# Patient Record
Sex: Female | Born: 1995 | Hispanic: Yes | Marital: Single | Smoking: Former smoker
Health system: Southern US, Community
[De-identification: ages and names within clinical notes are randomized; demographics above are authoritative.]

## PROBLEM LIST (undated history)

## (undated) ENCOUNTER — Inpatient Hospital Stay (HOSPITAL_COMMUNITY): Payer: Self-pay

## (undated) DIAGNOSIS — O1490 Unspecified pre-eclampsia, unspecified trimester: Secondary | ICD-10-CM

## (undated) DIAGNOSIS — Z789 Other specified health status: Secondary | ICD-10-CM

## (undated) DIAGNOSIS — F419 Anxiety disorder, unspecified: Secondary | ICD-10-CM

## (undated) DIAGNOSIS — O139 Gestational [pregnancy-induced] hypertension without significant proteinuria, unspecified trimester: Secondary | ICD-10-CM

## (undated) DIAGNOSIS — R519 Headache, unspecified: Secondary | ICD-10-CM

## (undated) HISTORY — DX: Unspecified pre-eclampsia, unspecified trimester: O14.90

## (undated) HISTORY — PX: NO PAST SURGERIES: SHX2092

---
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
## 2015-04-17 ENCOUNTER — Telehealth: Payer: Self-pay

## 2015-04-17 NOTE — Telephone Encounter (Signed)
have [facility] referral, but patient is already a patient of Dr. Verdell CarmineHarper's left a message for her to call us regarding appt.

## 2015-04-23 ENCOUNTER — Telehealth: Payer: Self-pay | Admitting: *Deleted

## 2015-04-23 NOTE — Telephone Encounter (Signed)
Patient referred to our office from [facility] for problems with her IUD.  Attempted to contact the patient and left message for patient to call the office to schedule appointment.

## 2015-08-24 NOTE — Telephone Encounter (Signed)
No return call from patient. Call to be re-filed.

## 2017-08-28 ENCOUNTER — Encounter (HOSPITAL_BASED_OUTPATIENT_CLINIC_OR_DEPARTMENT_OTHER): Payer: Self-pay

## 2017-08-28 ENCOUNTER — Emergency Department (HOSPITAL_BASED_OUTPATIENT_CLINIC_OR_DEPARTMENT_OTHER)
Admission: EM | Admit: 2017-08-28 | Discharge: 2017-08-28 | Disposition: A | Discharge status: Home / Self Care | Payer: Self-pay | Attending: Emergency Medicine | Admitting: Emergency Medicine

## 2017-08-28 ENCOUNTER — Other Ambulatory Visit: Payer: Self-pay

## 2017-08-28 DIAGNOSIS — R109 Unspecified abdominal pain: Secondary | ICD-10-CM | POA: Insufficient documentation

## 2017-08-28 DIAGNOSIS — Z5321 Procedure and treatment not carried out due to patient leaving prior to being seen by health care provider: Secondary | ICD-10-CM | POA: Insufficient documentation

## 2017-08-28 NOTE — ED Triage Notes (Signed)
C/o left flank pain x 2 weeks-NAD-steady gait

## 2018-02-17 ENCOUNTER — Ambulatory Visit: Payer: Self-pay | Admitting: Obstetrics

## 2018-06-30 ENCOUNTER — Ambulatory Visit (INDEPENDENT_AMBULATORY_CARE_PROVIDER_SITE_OTHER): Payer: Medicaid Other | Admitting: Obstetrics

## 2018-06-30 ENCOUNTER — Other Ambulatory Visit (HOSPITAL_COMMUNITY)
Admission: RE | Admit: 2018-06-30 | Discharge: 2018-06-30 | Disposition: A | Discharge status: Home / Self Care | Payer: Medicaid Other | Source: Ambulatory Visit | Attending: Obstetrics | Admitting: Obstetrics

## 2018-06-30 ENCOUNTER — Encounter: Payer: Self-pay | Admitting: Obstetrics

## 2018-06-30 VITALS — BP 121/81 | HR 96 | Wt 159.0 lb

## 2018-06-30 DIAGNOSIS — Z0000 Encounter for general adult medical examination without abnormal findings: Secondary | ICD-10-CM | POA: Diagnosis not present

## 2018-06-30 DIAGNOSIS — Z30016 Encounter for initial prescription of transdermal patch hormonal contraceptive device: Secondary | ICD-10-CM

## 2018-06-30 DIAGNOSIS — Z01419 Encounter for gynecological examination (general) (routine) without abnormal findings: Secondary | ICD-10-CM

## 2018-06-30 DIAGNOSIS — Z30432 Encounter for removal of intrauterine contraceptive device: Secondary | ICD-10-CM

## 2018-06-30 DIAGNOSIS — T8332XA Displacement of intrauterine contraceptive device, initial encounter: Secondary | ICD-10-CM

## 2018-06-30 DIAGNOSIS — F17210 Nicotine dependence, cigarettes, uncomplicated: Secondary | ICD-10-CM

## 2018-06-30 DIAGNOSIS — N946 Dysmenorrhea, unspecified: Secondary | ICD-10-CM

## 2018-06-30 DIAGNOSIS — Z3009 Encounter for other general counseling and advice on contraception: Secondary | ICD-10-CM

## 2018-06-30 MED ORDER — VARENICLINE TARTRATE 1 MG PO TABS: 1.0000 mg | ORAL_TABLET | Freq: Two times a day (BID) | ORAL | 1 refills | Status: DC

## 2018-06-30 MED ORDER — VARENICLINE TARTRATE 0.5 MG X 11 & 1 MG X 42 PO MISC: ORAL | 0 refills | Status: DC

## 2018-06-30 MED ORDER — NORELGESTROMIN-ETH ESTRADIOL 150-35 MCG/24HR TD PTWK: 1.0000 | MEDICATED_PATCH | TRANSDERMAL | 12 refills | Status: DC

## 2018-06-30 MED ORDER — IBUPROFEN 800 MG PO TABS: 800.0000 mg | ORAL_TABLET | Freq: Three times a day (TID) | ORAL | 5 refills | Status: DC | PRN

## 2018-06-30 NOTE — Progress Notes (Signed)
Subjective:        Robyn Stone is a 23 y.o. female here for a routine exam.  Current complaints: Pelvic pain for past few months.  She has a ParaGuard IUD in place. Evaluated recently in the ER at [facility], and her work-up was negative, including a normal pelvic ultrasound, but IUD was in the endometrial canal.    Personal health questionnaire:  Is patient Ashkenazi Jewish, have a family history of breast and/or ovarian cancer: no Is there a family history of uterine cancer diagnosed at age < 43, gastrointestinal cancer, urinary tract cancer, family member who is a Personnel officer syndrome-associated carrier: no Is the patient overweight and hypertensive, family history of diabetes, personal history of gestational diabetes, preeclampsia or PCOS: no Is patient over 63, have PCOS,  family history of premature CHD under age 27, diabetes, smoke, have hypertension or peripheral artery disease:  no At any time, has a partner hit, kicked or otherwise hurt or frightened you?: no Over the past 2 weeks, have you felt down, depressed or hopeless?: no Over the past 2 weeks, have you felt little interest or pleasure in doing things?:no   Gynecologic History Patient's last menstrual period was 06/03/2018. Contraception: IUD Last Pap: NONE. Results were: NONE Last mammogram: n/a. Results were: n/a  Obstetric History OB History  Gravida Para Term Preterm AB Living  1 1 1     1   SAB TAB Ectopic Multiple Live Births        0 1    # Outcome Date GA Lbr Len/2nd Weight Sex Delivery Anes PTL Lv  1 Term 08/29/14 [redacted]w[redacted]d 06:00 / 00:25 5 lb 7.1 oz (2.469 kg) M Vag-Spont EPI  LIV    Past Medical History:  Diagnosis Date  . Medical history non-contributory     Past Surgical History:  Procedure Laterality Date  . NO PAST SURGERIES       Current Outpatient Medications:  .  ibuprofen (ADVIL,MOTRIN) 800 MG tablet, Take 1 tablet (800 mg total) by mouth every 8 (eight) hours as needed., Disp: 30 tablet,  Rfl: 5 .  norelgestromin-ethinyl estradiol (XULANE) 150-35 MCG/24HR transdermal patch, Place 1 patch onto the skin once a week., Disp: 3 patch, Rfl: 12 .  varenicline (CHANTIX CONTINUING MONTH PAK) 1 MG tablet, Take 1 tablet (1 mg total) by mouth 2 (two) times daily., Disp: 60 tablet, Rfl: 1 .  varenicline (CHANTIX STARTING MONTH PAK) 0.5 MG X 11 & 1 MG X 42 tablet, Take one 0.5 mg tablet by mouth once daily for 3 days, then increase to one 0.5 mg tablet twice daily for 4 days, then increase to one 1 mg tablet twice daily., Disp: 53 tablet, Rfl: 0 No Known Allergies  Social History   Tobacco Use  . Smoking status: Current Every Day Smoker  . Smokeless tobacco: Never Used  . Tobacco comment: 1/2 day  Substance Use Topics  . Alcohol use: Yes    Alcohol/week: 0.0 standard drinks    Comment: weekly    Family History  Problem Relation Age of Onset  . Hypertension Mother       Review of Systems  Constitutional: negative for fatigue and weight loss Respiratory: negative for cough and wheezing Cardiovascular: negative for chest pain, fatigue and palpitations Gastrointestinal: negative for abdominal pain and change in bowel habits Musculoskeletal:negative for myalgias Neurological: negative for gait problems and tremors Behavioral/Psych: negative for abusive relationship, depression Endocrine: negative for temperature intolerance    Genitourinary:negative for abnormal  menstrual periods, genital lesions, hot flashes, sexual problems and vaginal discharge Integument/breast: negative for breast lump, breast tenderness, nipple discharge and skin lesion(s)    Objective:       BP 121/81   Pulse 96   Wt 159 lb (72.1 kg)   LMP 06/03/2018   BMI 29.08 kg/m  General:   alert  Skin:   no rash or abnormalities  Lungs:   clear to auscultation bilaterally  Heart:   regular rate and rhythm, S1, S2 normal, no murmur, click, rub or gallop  Breasts:   normal without suspicious masses, skin or  nipple changes or axillary nodes  Abdomen:  normal findings: no organomegaly, soft, non-tender and no hernia  Pelvis:  External genitalia: normal general appearance Urinary system: urethral meatus normal and bladder without fullness, nontender Vaginal: normal without tenderness, induration or masses Cervix: normal appearance Adnexa: normal bimanual exam Uterus: anteverted and non-tender, normal size   Lab Review Urine pregnancy test Labs reviewed yes Radiologic studies reviewed yes  50% of 25 min visit spent on counseling and coordination of care.   Assessment:     1. Encounter for routine gynecological examination with Papanicolaou smear of cervix Rx: - Cytology - PAP( [facility]) - varenicline (CHANTIX CONTINUING MONTH PAK) 1 MG tablet; Take 1 tablet (1 mg total) by mouth 2 (two) times daily.  Dispense: 60 tablet; Refill: 1  2. Tobacco dependence due to cigarettes Rx: - varenicline (CHANTIX STARTING MONTH PAK) 0.5 MG X 11 & 1 MG X 42 tablet; Take one 0.5 mg tablet by mouth once daily for 3 days, then increase to one 0.5 mg tablet twice daily for 4 days, then increase to one 1 mg tablet twice daily.  Dispense: 53 tablet; Refill: 0  3. Dysmenorrhea Rx: - ibuprofen (ADVIL,MOTRIN) 800 MG tablet; Take 1 tablet (800 mg total) by mouth every 8 (eight) hours as needed.  Dispense: 30 tablet; Refill: 5  4. Displacement of intrauterine contraceptive device, initial encounter  5. Encounter for removal of intrauterine contraceptive device (IUD)  6. Encounter for other general counseling and advice on contraception - wants IUD removed and start the Patch  7. Encounter for initial prescription of transdermal patch hormonal contraceptive device Rx: - norelgestromin-ethinyl estradiol [employer](XULANE) 150-35 MCG/24HR transdermal patch; Place 1 patch onto the skin once a week.  Dispense: 3 patch; Refill: 12    Plan:    Education reviewed: calcium supplements, depression evaluation, low fat, low  cholesterol diet, safe sex/STD prevention, self breast exams, smoking cessation and weight bearing exercise. Contraception: [employer] Patches weekly. Follow up in: 1 year.   Meds ordered this encounter  Medications  . DISCONTD: varenicline (CHANTIX STARTING MONTH PAK) 0.5 MG X 11 & 1 MG X 42 tablet    Sig: Take one 0.5 mg tablet by mouth once daily for 3 days, then increase to one 0.5 mg tablet twice daily for 4 days, then increase to one 1 mg tablet twice daily.    Dispense:  53 tablet    Refill:  0  . DISCONTD: varenicline (CHANTIX CONTINUING MONTH PAK) 1 MG tablet    Sig: Take 1 tablet (1 mg total) by mouth 2 (two) times daily.    Dispense:  60 tablet    Refill:  1  . ibuprofen (ADVIL,MOTRIN) 800 MG tablet    Sig: Take 1 tablet (800 mg total) by mouth every 8 (eight) hours as needed.    Dispense:  30 tablet    Refill:  5  .  norelgestromin-ethinyl estradiol [employer](XULANE) 150-35 MCG/24HR transdermal patch    Sig: Place 1 patch onto the skin once a week.    Dispense:  3 patch    Refill:  12  . DISCONTD: varenicline (CHANTIX STARTING MONTH PAK) 0.5 MG X 11 & 1 MG X 42 tablet    Sig: Take one 0.5 mg tablet by mouth once daily for 3 days, then increase to one 0.5 mg tablet twice daily for 4 days, then increase to one 1 mg tablet twice daily.    Dispense:  53 tablet    Refill:  0  . DISCONTD: varenicline (CHANTIX CONTINUING MONTH PAK) 1 MG tablet    Sig: Take 1 tablet (1 mg total) by mouth 2 (two) times daily.    Dispense:  60 tablet    Refill:  1  . varenicline (CHANTIX STARTING MONTH PAK) 0.5 MG X 11 & 1 MG X 42 tablet    Sig: Take one 0.5 mg tablet by mouth once daily for 3 days, then increase to one 0.5 mg tablet twice daily for 4 days, then increase to one 1 mg tablet twice daily.    Dispense:  53 tablet    Refill:  0  . varenicline (CHANTIX CONTINUING MONTH PAK) 1 MG tablet    Sig: Take 1 tablet (1 mg total) by mouth 2 (two) times daily.    Dispense:  60 tablet    Refill:  1   No  orders of the defined types were placed in this encounter.   Brock BadHARLES A.  MD 06-30-2018      GYNECOLOGY OFFICE PROCEDURE NOTE  Drue Secondshley J Bagg is a 23 y.o. G1P1001 here for ParaGuard IUD removal because of pelvic pain.  IUD Removal  Patient identified, informed consent performed, consent signed.  Patient was in the dorsal lithotomy position, normal external genitalia was noted.  A speculum was placed in the patient's vagina, normal discharge was noted, no lesions. The cervix was visualized, no lesions, no abnormal discharge.  The strings of the IUD were grasped and pulled using ring forceps. The IUD was removed in its entirety.   Patient tolerated the procedure well.    Patient will use the Xulane Patch for contraception.  Routine preventative health maintenance measures emphasized.   Brock BadHARLES A.  MD, MD, FACOG Obstetrician & Gynecologist, [facility], [facility] 06-30-2018

## 2018-07-01 ENCOUNTER — Encounter (HOSPITAL_COMMUNITY): Payer: Self-pay

## 2018-07-06 LAB — CYTOLOGY - PAP
DIAGNOSIS: UNDETERMINED — AB
HPV: DETECTED — AB

## 2018-09-11 DIAGNOSIS — R52 Pain, unspecified: Secondary | ICD-10-CM | POA: Diagnosis not present

## 2018-09-11 DIAGNOSIS — R51 Headache: Secondary | ICD-10-CM | POA: Diagnosis not present

## 2018-09-11 DIAGNOSIS — R000 Tachycardia, unspecified: Secondary | ICD-10-CM | POA: Diagnosis not present

## 2018-09-11 DIAGNOSIS — S0990XA Unspecified injury of head, initial encounter: Secondary | ICD-10-CM | POA: Diagnosis not present

## 2018-10-08 ENCOUNTER — Other Ambulatory Visit: Payer: Self-pay

## 2018-10-08 ENCOUNTER — Emergency Department (HOSPITAL_BASED_OUTPATIENT_CLINIC_OR_DEPARTMENT_OTHER)
Admission: EM | Admit: 2018-10-08 | Discharge: 2018-10-08 | Disposition: A | Discharge status: Home / Self Care | Payer: Medicaid Other | Attending: Emergency Medicine | Admitting: Emergency Medicine

## 2018-10-08 ENCOUNTER — Emergency Department (HOSPITAL_BASED_OUTPATIENT_CLINIC_OR_DEPARTMENT_OTHER): Payer: Medicaid Other

## 2018-10-08 ENCOUNTER — Encounter (HOSPITAL_BASED_OUTPATIENT_CLINIC_OR_DEPARTMENT_OTHER): Payer: Self-pay

## 2018-10-08 DIAGNOSIS — R102 Pelvic and perineal pain: Secondary | ICD-10-CM | POA: Diagnosis not present

## 2018-10-08 DIAGNOSIS — O9989 Other specified diseases and conditions complicating pregnancy, childbirth and the puerperium: Secondary | ICD-10-CM | POA: Insufficient documentation

## 2018-10-08 DIAGNOSIS — Z79899 Other long term (current) drug therapy: Secondary | ICD-10-CM | POA: Insufficient documentation

## 2018-10-08 DIAGNOSIS — R1031 Right lower quadrant pain: Secondary | ICD-10-CM | POA: Diagnosis not present

## 2018-10-08 DIAGNOSIS — F17210 Nicotine dependence, cigarettes, uncomplicated: Secondary | ICD-10-CM | POA: Insufficient documentation

## 2018-10-08 DIAGNOSIS — Z3490 Encounter for supervision of normal pregnancy, unspecified, unspecified trimester: Secondary | ICD-10-CM

## 2018-10-08 DIAGNOSIS — O99330 Smoking (tobacco) complicating pregnancy, unspecified trimester: Secondary | ICD-10-CM | POA: Diagnosis not present

## 2018-10-08 LAB — COMPREHENSIVE METABOLIC PANEL
ALT: 15 U/L (ref 0–44)
AST: 18 U/L (ref 15–41)
Albumin: 4.4 g/dL (ref 3.5–5.0)
Alkaline Phosphatase: 65 U/L (ref 38–126)
Anion gap: 9 (ref 5–15)
BUN: 10 mg/dL (ref 6–20)
CO2: 25 mmol/L (ref 22–32)
Calcium: 8.9 mg/dL (ref 8.9–10.3)
Chloride: 105 mmol/L (ref 98–111)
Creatinine, Ser: 0.61 mg/dL (ref 0.44–1.00)
GFR calc Af Amer: >60 mL/min (ref >60)
GFR calc non Af Amer: >60 mL/min (ref >60)
Glucose, Bld: 94 mg/dL (ref 70–99)
Potassium: 3.7 mmol/L (ref 3.5–5.1)
Sodium: 139 mmol/L (ref 135–145)
Total Bilirubin: 0.5 mg/dL (ref 0.3–1.2)
Total Protein: 7.6 g/dL (ref 6.5–8.1)

## 2018-10-08 LAB — CBC WITH DIFFERENTIAL/PLATELET
Abs Immature Granulocytes: 0.03 10*3/uL (ref 0.00–0.07)
Basophils Absolute: 0 10*3/uL (ref 0.0–0.1)
Basophils Relative: 0 %
Eosinophils Absolute: 0.4 10*3/uL (ref 0.0–0.5)
Eosinophils Relative: 4 %
HCT: 39.6 % (ref 36.0–46.0)
Hemoglobin: 12.2 g/dL (ref 12.0–15.0)
Immature Granulocytes: 0 %
Lymphocytes Relative: 27 %
Lymphs Abs: 2.6 10*3/uL (ref 0.7–4.0)
MCH: 26 pg (ref 26.0–34.0)
MCHC: 30.8 g/dL (ref 30.0–36.0)
MCV: 84.3 fL (ref 80.0–100.0)
Monocytes Absolute: 0.6 10*3/uL (ref 0.1–1.0)
Monocytes Relative: 7 %
Neutro Abs: 5.9 10*3/uL (ref 1.7–7.7)
Neutrophils Relative %: 62 %
Platelets: 340 10*3/uL (ref 150–400)
RBC: 4.7 MIL/uL (ref 3.87–5.11)
RDW: 16.8 % — ABNORMAL HIGH (ref 11.5–15.5)
WBC: 9.6 10*3/uL (ref 4.0–10.5)
nRBC: 0 % (ref 0.0–0.2)

## 2018-10-08 LAB — URINALYSIS, ROUTINE W REFLEX MICROSCOPIC
Bilirubin Urine: NEGATIVE
Glucose, UA: NEGATIVE mg/dL
Hgb urine dipstick: NEGATIVE
Ketones, ur: NEGATIVE mg/dL
Nitrite: NEGATIVE
Protein, ur: NEGATIVE mg/dL
Specific Gravity, Urine: 1.015 (ref 1.005–1.030)
pH: 6.5 (ref 5.0–8.0)

## 2018-10-08 LAB — URINALYSIS, MICROSCOPIC (REFLEX)

## 2018-10-08 LAB — HCG, QUANTITATIVE, PREGNANCY: hCG, Beta Chain, Quant, S: 48 m[IU]/mL — ABNORMAL HIGH (ref <5)

## 2018-10-08 LAB — WET PREP, GENITAL
Sperm: NONE SEEN
Trich, Wet Prep: NONE SEEN
Yeast Wet Prep HPF POC: NONE SEEN

## 2018-10-08 LAB — LIPASE, BLOOD: Lipase: 41 U/L (ref 11–51)

## 2018-10-08 LAB — PREGNANCY, URINE: Preg Test, Ur: POSITIVE — AB

## 2018-10-08 MED ORDER — ACETAMINOPHEN 325 MG PO TABS
650.0000 mg | ORAL_TABLET | Freq: Once | ORAL | Status: AC
  Administered 2018-10-08: 18:00:00 650 mg via ORAL
  Filled 2018-10-08: qty 2

## 2018-10-08 MED ORDER — PRENATAL COMPLETE 14-0.4 MG PO TABS: 1.0000 | ORAL_TABLET | Freq: Every day | ORAL | 0 refills | Status: DC

## 2018-10-08 MED ORDER — SODIUM CHLORIDE 0.9 % IV BOLUS
1000.0000 mL | Freq: Once | INTRAVENOUS | Status: AC
  Administered 2018-10-08: 1000 mL via INTRAVENOUS

## 2018-10-08 MED ORDER — METRONIDAZOLE 500 MG PO TABS: 500.0000 mg | ORAL_TABLET | Freq: Two times a day (BID) | ORAL | 0 refills | Status: DC

## 2018-10-08 NOTE — ED Provider Notes (Signed)
Medical screening examination/treatment/procedure(s) were conducted as a shared visit with non-physician practitioner(s) and myself.  I personally evaluated the patient during the encounter.  None Reports that she had similar type of pain with her last pregnancy.  Pain is been present off and on for a week.  Reports she only notices it when she is sitting or at rest.  No pain with walking or moving around.  Patient has already had ultrasound and some Tylenol for pain.  She reports at this time pain is resolved.  Patient is alert and nontoxic.  She is well in appearance.  No distress.  Abdomen is soft without guarding or tenderness.  This time, patient is stable to continue outpatient follow-up with GYN for repeat quant and monitoring for early pregnancy versus threatened abortion.   Arby Barrette, MD 10/08/18 2031

## 2018-10-08 NOTE — ED Provider Notes (Signed)
[facility] EMERGENCY DEPARTMENT Provider Note   CSN: [id number] Arrival date & time: 10/08/18  1638    History   Chief Complaint Chief Complaint  Patient presents with  . Abdominal Pain    HPI Robyn Stone is a 23 y.o. female who presents today for evaluation of abdominal pain since Monday.  She reports that she has had mid to right-sided abdominal pain since Monday.  She had vaginal spotting approximately 1 week ago which she felt was her menstrual period.  Her last normal menstrual cycle was the last week of April.  She reports that she has been nauseous and vomiting about 3 times a day for the past 2 weeks.  She does not believe she is pregnant.  Denies any trauma.  No diarrhea.  Her abdominal pain is constant and does not change.  She denies any recent trauma or sick contacts.  She did report to her nurse that she had to get a legal blood alcohol level drawn last night after getting pulled over.  She denies any dysuria, increased frequency or urgency.     HPI  Past Medical History:  Diagnosis Date  . Medical history non-contributory     Patient Active Problem List   Diagnosis Date Noted  . Postpartum care and examination of lactating mother 08/30/2014  . Labor abnormal 08/25/2014  . [redacted] weeks gestation of pregnancy   . Pre-eclampsia 08/24/2014  . Cannabis abuse 04/05/2014  . Pregnancy, supervision, normal, first 03/08/2014    Past Surgical History:  Procedure Laterality Date  . NO PAST SURGERIES       OB History    Gravida  1   Para  1   Term  1   Preterm      AB      Living  1     SAB      TAB      Ectopic      Multiple  0   Live Births  1            Home Medications    Prior to Admission medications   Medication Sig Start Date End Date Taking? Authorizing Provider  ibuprofen (ADVIL,MOTRIN) 800 MG tablet Take 1 tablet (800 mg total) by mouth every 8 (eight) hours as needed. 06/30/18   Brock BadHarper, Charles A, MD  metroNIDAZOLE  (FLAGYL) 500 MG tablet Take 1 tablet (500 mg total) by mouth 2 (two) times daily. 10/08/18   Cristina GongHammond, Cattaleya Wien W, PA-C  norelgestromin-ethinyl estradiol Burr Medico(XULANE) 150-35 MCG/24HR transdermal patch Place 1 patch onto the skin once a week. 06/30/18   Brock BadHarper, Charles A, MD  Prenatal Vit-Fe Fumarate-FA (PRENATAL COMPLETE) 14-0.4 MG TABS Take 1 tablet by mouth daily. 10/08/18   Cristina GongHammond, Oreatha Fabry W, PA-C  varenicline (CHANTIX CONTINUING MONTH PAK) 1 MG tablet Take 1 tablet (1 mg total) by mouth 2 (two) times daily. 06/30/18   Brock BadHarper, Charles A, MD  varenicline (CHANTIX STARTING MONTH PAK) 0.5 MG X 11 & 1 MG X 42 tablet Take one 0.5 mg tablet by mouth once daily for 3 days, then increase to one 0.5 mg tablet twice daily for 4 days, then increase to one 1 mg tablet twice daily. 06/30/18   Brock BadHarper, Charles A, MD    Family History Family History  Problem Relation Age of Onset  . Hypertension Mother     Social History Social History   Tobacco Use  . Smoking status: Current Every Day Smoker    Types: Cigarettes  .  Smokeless tobacco: Never Used  . Tobacco comment: 1/2 day  Substance Use Topics  . Alcohol use: Not Currently    Alcohol/week: 0.0 standard drinks  . Drug use: Yes    Types: Marijuana     Allergies   Patient has no known allergies.   Review of Systems Review of Systems  Constitutional: Negative for chills and fever.  HENT: Negative for congestion.   Respiratory: Negative for chest tightness and shortness of breath.   Gastrointestinal: Positive for abdominal pain, nausea and vomiting. Negative for constipation and diarrhea.  Genitourinary: Positive for pelvic pain and vaginal bleeding. Negative for dysuria and frequency.  Musculoskeletal: Negative for back pain and neck pain.  Skin: Negative for color change, rash and wound.  Neurological: Negative for weakness and headaches.  Psychiatric/Behavioral: Negative for confusion.  All other systems reviewed and are negative.     Physical Exam Updated Vital Signs BP 129/72 (BP Location: Right Arm)   Pulse 66   Temp 98.3 F (36.8 C) (Oral)   Resp 18   Ht 5\' 2"  (1.575 m)   Wt 67.6 kg   LMP 09/25/2018 (Approximate)   SpO2 99%   BMI 27.25 kg/m   Physical Exam Vitals signs and nursing note reviewed. Exam conducted with a chaperone present Theora Gianotti, Charity fundraiser. ).  Constitutional:      General: She is not in acute distress.    Appearance: She is well-developed.  HENT:     Head: Normocephalic and atraumatic.     Mouth/Throat:     Mouth: Mucous membranes are moist.  Eyes:     Conjunctiva/sclera: Conjunctivae normal.  Neck:     Musculoskeletal: Neck supple.  Cardiovascular:     Rate and Rhythm: Normal rate and regular rhythm.     Heart sounds: Normal heart sounds. No murmur.  Pulmonary:     Effort: Pulmonary effort is normal. No respiratory distress.     Breath sounds: Normal breath sounds.  Abdominal:     General: Bowel sounds are normal.     Palpations: Abdomen is soft.     Tenderness: There is abdominal tenderness in the right lower quadrant and suprapubic area. There is no right CVA tenderness or left CVA tenderness. Negative signs include Murphy's sign.     Hernia: No hernia is present.  Genitourinary:    Comments: Cervical os is closed.  Cervix is slightly erythematous.  No bleeding or blood in the vaginal canal.  She has no cervical motion tenderness.  No left lower adnexal tenderness or fullness.  Right adnexa is tender, does not feel full.  She has more pain with internal palpation of right adnexa then she has pain with bimanual examination. Skin:    General: Skin is warm and dry.  Neurological:     Mental Status: She is alert.      ED Treatments / Results  Labs (all labs ordered are listed, but only abnormal results are displayed) Labs Reviewed  WET PREP, GENITAL - Abnormal; Notable for the following components:      Result Value   Clue Cells Wet Prep HPF POC PRESENT (*)    WBC, Wet Prep HPF POC  MODERATE (*)    All other components within normal limits  URINALYSIS, ROUTINE W REFLEX MICROSCOPIC - Abnormal; Notable for the following components:   APPearance HAZY (*)    Leukocytes,Ua SMALL (*)    All other components within normal limits  PREGNANCY, URINE - Abnormal; Notable for the following components:   Preg Test,  Ur POSITIVE (*)    All other components within normal limits  CBC WITH DIFFERENTIAL/PLATELET - Abnormal; Notable for the following components:   RDW 16.8 (*)    All other components within normal limits  URINALYSIS, MICROSCOPIC (REFLEX) - Abnormal; Notable for the following components:   Bacteria, UA MANY (*)    All other components within normal limits  HCG, QUANTITATIVE, PREGNANCY - Abnormal; Notable for the following components:   hCG, Beta Chain, Quant, S 48 (*)    All other components within normal limits  URINE CULTURE  COMPREHENSIVE METABOLIC PANEL  LIPASE, BLOOD  GC/CHLAMYDIA PROBE AMP ([place]) NOT AT [facility]    EKG None  Radiology US Ob Comp < 14 Wks  Result Date: 10/08/2018 CLINICAL DATA:  Right lower quadrant pain/cramping with nausea and vomiting for 1 week. EXAM: OBSTETRIC <14 WK [place] AND TRANSVAGINAL OB US TECHNIQUE: Both transabdominal and transvaginal ultrasound examinations were performed for complete evaluation of the gestation as well as the maternal uterus, adnexal regions, and pelvic cul-de-sac. Transvaginal technique was performed to assess early pregnancy. COMPARISON:  None. FINDINGS: Intrauterine gestational sac: Not visualized Maternal uterus/adnexae: Unremarkable appearance of the uterus and ovaries. Trace pelvic free fluid. No adnexal mass. IMPRESSION: 1. No intrauterine or ectopic pregnancy identified. 2. Unremarkable appearance of the uterus and ovaries. Electronically Signed   By: Sebastian Ache M.D.   On: 10/08/2018 19:20   US Ob Transvaginal  Result Date: 10/08/2018 CLINICAL DATA:  Right lower quadrant pain/cramping with nausea and  vomiting for 1 week. EXAM: OBSTETRIC <14 WK [place] AND TRANSVAGINAL OB US TECHNIQUE: Both transabdominal and transvaginal ultrasound examinations were performed for complete evaluation of the gestation as well as the maternal uterus, adnexal regions, and pelvic cul-de-sac. Transvaginal technique was performed to assess early pregnancy. COMPARISON:  None. FINDINGS: Intrauterine gestational sac: Not visualized Maternal uterus/adnexae: Unremarkable appearance of the uterus and ovaries. Trace pelvic free fluid. No adnexal mass. IMPRESSION: 1. No intrauterine or ectopic pregnancy identified. 2. Unremarkable appearance of the uterus and ovaries. Electronically Signed   By: Sebastian Ache M.D.   On: 10/08/2018 19:20   US Appendix (abdomen Limited)  Result Date: 10/08/2018 CLINICAL DATA:  Right lower quadrant cramping for 1 week with nausea and vomiting. Positive pregnancy test. EXAM: ULTRASOUND ABDOMEN LIMITED TECHNIQUE: Wallace Cullens scale imaging of the right lower quadrant was performed to evaluate for suspected appendicitis. Standard imaging planes and graded compression technique were utilized. COMPARISON:  None. FINDINGS: The appendix is not visualized. Ancillary findings: None. Factors affecting image quality: None. IMPRESSION: Non visualization of the appendix. Non-visualization of appendix by [place] does not definitely exclude appendicitis. If there is sufficient clinical concern, consider MRI of the abdomen and pelvis for further evaluation. Electronically Signed   By: Gaylyn Rong M.D.   On: 10/08/2018 19:19    Procedures Procedures (including critical care time)  Medications Ordered in ED Medications  sodium chloride 0.9 % bolus 1,000 mL (0 mLs Intravenous Stopped 10/08/18 1804)  acetaminophen (TYLENOL) tablet 650 mg (650 mg Oral Given 10/08/18 1806)     Initial Impression / Assessment and Plan / ED Course  I have reviewed the triage vital signs and the nursing notes.  Pertinent labs & imaging results that  were available during my care of the patient were reviewed by me and considered in my medical decision making (see chart for details).  Clinical Course as of Oct 08 2327  Fri Oct 08, 2018  2149 Patient has been reevaluated, her abdomen is  soft, nontender and she has no complaints at this time.  She is requesting discharge.   [EH]    Clinical Course User Index [EH] Cristina Gong, PA-C      Patient is a G75, P6 22 year old woman who presents today for evaluation of abdominal pain.  On arrival urine pregnancy test was obtained which was positive.  Patient was unaware of this.  Urinalysis showed many bacteria with 11-20 whites and 6-10 squamous epithelial cells.  She is not having any dysuria, increased frequency or urgency.  Her urine was sent for culture.  Chart review from previous pregnancy shows that her blood type is positive, and she would not be a RhoGam candidate.  She is not anemic, her white count is not significantly elevated, and she does not have any significant electrolyte or hematologic derangements.  Lipase is not elevated.  Based on the right lower quadrant nature of her pain concern for appendicitis versus ectopic pregnancy.   Pelvic exam showed a erythematous cervix that was closed with right adnexal tenderness.  Pelvic ultrasound showed no visualized intrauterine pregnancy or ectopic pregnancy.  Attempt for appendix evaluation by ultrasound was performed however was unable to be located.  Her pain was treated with 1 L of saline and Tylenol.  hCG quant was obtained, resulted after patient had gone for ultrasound and was 48, therefore would not expect to see evidence of gestation and significantly reduces concern for ectopic.  On repeat evaluation her pain had fully resolved and she requested discharge home.  Her wet prep did show evidence of clue cells, will treat with Flagyl.  We discussed that she should not take this medicine as she has consumed alcohol in the past 24 hours  however should wait until then to start taking it.  We also discussed the need to abstain from alcohol during pregnancy.  She is given prescription for prenatal vitamins.  Recommended close OB/GYN follow-up.  Return precautions were discussed with patient who states their understanding.  At the time of discharge patient denied any unaddressed complaints or concerns.  Patient is agreeable for discharge home.  This patient was seen as a shared visit with Dr. Donnald Garre.   Final Clinical Impressions(s) / ED Diagnoses   Final diagnoses:  Right lower quadrant abdominal pain  Pregnancy, unspecified gestational age    ED Discharge Orders         Ordered    metroNIDAZOLE (FLAGYL) 500 MG tablet  2 times daily     10/08/18 2141    Prenatal Vit-Fe Fumarate-FA (PRENATAL COMPLETE) 14-0.4 MG TABS  Daily     10/08/18 2150           Cristina Gong, PA-C 10/09/18 0017    Arby Barrette, MD 10/10/18 2355

## 2018-10-08 NOTE — ED Notes (Signed)
Patient transported to Ultrasound 

## 2018-10-08 NOTE — ED Notes (Signed)
ED Provider at bedside. 

## 2018-10-08 NOTE — Discharge Instructions (Addendum)
Please do not drink any alcohol.  Please start taking a prenatal vitamin every day.  I have given you information for [facility] outpatient OB/GYN clinic.  Please call them to make an appointment.  If your symptoms worsen or you have additional concerns please go to [facility] in [place], or if you have concerning pain and vaginal bleeding please go to the [facility] hospital.   Please take Tylenol (acetaminophen) to relieve your pain.  You may take tylenol, up to 1,000 mg (two extra strength pills).  Do not take more than 3,000 mg tylenol in a 24 hour period.  Please check all medication labels as many medications such as pain and cold medications may contain tylenol. Please do not drink alcohol while taking this medication.    Today your diagnosed with bacterial vaginosis and received a prescription for metronidazole also known as Flagyl. It is very important that you do not consume any alcohol while taking this medication as it will cause you to become violently ill.  Do not take the Flagyl until you have not had any alcohol for 24 hours.

## 2018-10-08 NOTE — ED Triage Notes (Signed)
C/o abd cramps x today-n/v x 2 days-NAD-steady gait

## 2018-10-09 ENCOUNTER — Encounter (HOSPITAL_BASED_OUTPATIENT_CLINIC_OR_DEPARTMENT_OTHER): Payer: Self-pay | Admitting: Physician Assistant

## 2018-10-10 LAB — URINE CULTURE

## 2018-10-11 LAB — GC/CHLAMYDIA PROBE AMP (~~LOC~~) NOT AT ARMC
Chlamydia: POSITIVE — AB
Neisseria Gonorrhea: NEGATIVE

## 2018-10-20 DIAGNOSIS — N939 Abnormal uterine and vaginal bleeding, unspecified: Secondary | ICD-10-CM | POA: Diagnosis not present

## 2018-10-20 DIAGNOSIS — F17210 Nicotine dependence, cigarettes, uncomplicated: Secondary | ICD-10-CM | POA: Diagnosis not present

## 2018-12-09 DIAGNOSIS — Z3480 Encounter for supervision of other normal pregnancy, unspecified trimester: Secondary | ICD-10-CM | POA: Insufficient documentation

## 2018-12-10 ENCOUNTER — Encounter: Payer: Medicaid Other | Admitting: Certified Nurse Midwife

## 2019-02-14 ENCOUNTER — Emergency Department (HOSPITAL_BASED_OUTPATIENT_CLINIC_OR_DEPARTMENT_OTHER)
Admission: EM | Admit: 2019-02-14 | Discharge: 2019-02-14 | Disposition: A | Discharge status: Home / Self Care | Payer: Medicaid Other | Attending: Emergency Medicine | Admitting: Emergency Medicine

## 2019-02-14 ENCOUNTER — Emergency Department (HOSPITAL_BASED_OUTPATIENT_CLINIC_OR_DEPARTMENT_OTHER): Payer: Medicaid Other

## 2019-02-14 ENCOUNTER — Other Ambulatory Visit: Payer: Self-pay

## 2019-02-14 ENCOUNTER — Encounter (HOSPITAL_BASED_OUTPATIENT_CLINIC_OR_DEPARTMENT_OTHER): Payer: Self-pay

## 2019-02-14 DIAGNOSIS — R51 Headache: Secondary | ICD-10-CM | POA: Diagnosis not present

## 2019-02-14 DIAGNOSIS — J069 Acute upper respiratory infection, unspecified: Secondary | ICD-10-CM

## 2019-02-14 DIAGNOSIS — B9789 Other viral agents as the cause of diseases classified elsewhere: Secondary | ICD-10-CM | POA: Diagnosis not present

## 2019-02-14 DIAGNOSIS — Z79899 Other long term (current) drug therapy: Secondary | ICD-10-CM | POA: Diagnosis not present

## 2019-02-14 DIAGNOSIS — Z20828 Contact with and (suspected) exposure to other viral communicable diseases: Secondary | ICD-10-CM | POA: Diagnosis not present

## 2019-02-14 DIAGNOSIS — R05 Cough: Secondary | ICD-10-CM | POA: Diagnosis not present

## 2019-02-14 DIAGNOSIS — F17210 Nicotine dependence, cigarettes, uncomplicated: Secondary | ICD-10-CM | POA: Insufficient documentation

## 2019-02-14 LAB — GROUP A STREP BY PCR: Group A Strep by PCR: NOT DETECTED

## 2019-02-14 MED ORDER — BENZONATATE 100 MG PO CAPS: 100.0000 mg | ORAL_CAPSULE | Freq: Three times a day (TID) | ORAL | 0 refills | Status: DC | PRN

## 2019-02-14 MED ORDER — ACETAMINOPHEN 325 MG PO TABS
650.0000 mg | ORAL_TABLET | Freq: Once | ORAL | Status: AC
  Administered 2019-02-14: 650 mg via ORAL
  Filled 2019-02-14: qty 2

## 2019-02-14 MED ORDER — CEPASTAT 14.5 MG MT LOZG: 1.0000 | LOZENGE | Freq: Four times a day (QID) | OROMUCOSAL | 0 refills | Status: DC | PRN

## 2019-02-14 NOTE — ED Provider Notes (Signed)
[facility] EMERGENCY DEPARTMENT Provider Note   CSN: [id number] Arrival date & time: 02/14/19  1110     History   Chief Complaint Chief Complaint  Patient presents with  . Cough  . Headache    HPI Robyn Stone is a 23 y.o. female.     HPI 23 year old female presents with cough, headache, sore throat.  Ongoing for over a week.  Recently traveled to and from [place].  No fevers but has had some chills.  No shortness of breath.  There is pain with swallowing but no difficulty swallowing.  No vomiting.  No  Significant past medical history.  Past Medical History:  Diagnosis Date  . Medical history non-contributory     Patient Active Problem List   Diagnosis Date Noted  . Supervision of other normal pregnancy, antepartum 12/09/2018  . Postpartum care and examination of lactating mother 08/30/2014  . Labor abnormal 08/25/2014  . [redacted] weeks gestation of pregnancy   . Pre-eclampsia 08/24/2014  . Cannabis abuse 04/05/2014  . Pregnancy, supervision, normal, first 03/08/2014    Past Surgical History:  Procedure Laterality Date  . NO PAST SURGERIES       OB History    Gravida  2   Para  1   Term  1   Preterm      AB      Living  1     SAB      TAB      Ectopic      Multiple  0   Live Births  1            Home Medications    Prior to Admission medications   Medication Sig Start Date End Date Taking? Authorizing Provider  benzonatate (TESSALON) 100 MG capsule Take 1 capsule (100 mg total) by mouth 3 (three) times daily as needed for cough. 02/14/19   Pricilla LovelessGoldston, Jerline Linzy, MD  ibuprofen (ADVIL,MOTRIN) 800 MG tablet Take 1 tablet (800 mg total) by mouth every 8 (eight) hours as needed. 06/30/18   Brock BadHarper, Charles A, MD  metroNIDAZOLE (FLAGYL) 500 MG tablet Take 1 tablet (500 mg total) by mouth 2 (two) times daily. 10/08/18   Cristina GongHammond, Elizabeth W, PA-C  norelgestromin-ethinyl estradiol Burr Medico(XULANE) 150-35 MCG/24HR transdermal patch Place 1 patch onto  the skin once a week. 06/30/18   Brock BadHarper, Charles A, MD  phenol-menthol (CEPASTAT) 14.5 MG lozenge Place 1 lozenge inside cheek 4 (four) times daily as needed for sore throat. 02/14/19   Pricilla LovelessGoldston, Dajon Lazar, MD  Prenatal Vit-Fe Fumarate-FA (PRENATAL COMPLETE) 14-0.4 MG TABS Take 1 tablet by mouth daily. 10/08/18   Cristina GongHammond, Elizabeth W, PA-C  varenicline (CHANTIX CONTINUING MONTH PAK) 1 MG tablet Take 1 tablet (1 mg total) by mouth 2 (two) times daily. 06/30/18   Brock BadHarper, Charles A, MD  varenicline (CHANTIX STARTING MONTH PAK) 0.5 MG X 11 & 1 MG X 42 tablet Take one 0.5 mg tablet by mouth once daily for 3 days, then increase to one 0.5 mg tablet twice daily for 4 days, then increase to one 1 mg tablet twice daily. 06/30/18   Brock BadHarper, Charles A, MD    Family History Family History  Problem Relation Age of Onset  . Hypertension Mother     Social History Social History   Tobacco Use  . Smoking status: Current Every Day Smoker    Types: Cigarettes  . Smokeless tobacco: Never Used  . Tobacco comment: 1/2 day  Substance Use Topics  . Alcohol use:  Yes    Alcohol/week: 0.0 standard drinks    Comment: occ  . Drug use: Yes    Types: Marijuana     Allergies   Patient has no known allergies.   Review of Systems Review of Systems  Constitutional: Positive for chills. Negative for fever.  HENT: Positive for sore throat.   Respiratory: Positive for cough. Negative for shortness of breath.   Gastrointestinal: Negative for vomiting.  Neurological: Positive for headaches.  All other systems reviewed and are negative.    Physical Exam Updated Vital Signs BP 117/88 (BP Location: Left Arm)   Pulse 77   Temp 98.6 F (37 C) (Oral)   Resp 14   Ht 5\' 2"  (1.575 m)   Wt 68.5 kg   LMP 02/04/2019   SpO2 98%   Breastfeeding Unknown   BMI 27.62 kg/m   Physical Exam Vitals signs and nursing note reviewed.  Constitutional:      General: She is not in acute distress.    Appearance: She is  well-developed. She is not ill-appearing or diaphoretic.  HENT:     Head: Normocephalic and atraumatic.     Right Ear: External ear normal.     Left Ear: External ear normal.     Nose: Nose normal.     Mouth/Throat:     Pharynx: Oropharynx is clear.  Eyes:     General:        Right eye: No discharge.        Left eye: No discharge.  Neck:     Musculoskeletal: Neck supple.  Cardiovascular:     Rate and Rhythm: Normal rate and regular rhythm.     Heart sounds: Normal heart sounds.  Pulmonary:     Effort: Pulmonary effort is normal.     Breath sounds: Normal breath sounds.  Abdominal:     Palpations: Abdomen is soft.     Tenderness: There is no abdominal tenderness.  Skin:    General: Skin is warm and dry.  Neurological:     Mental Status: She is alert.  Psychiatric:        Mood and Affect: Mood is not anxious.      ED Treatments / Results  Labs (all labs ordered are listed, but only abnormal results are displayed) Labs Reviewed  GROUP A STREP BY PCR  NOVEL CORONAVIRUS, NAA (HOSP ORDER, SEND-OUT TO REF LAB; TAT 18-24 HRS)    EKG None  Radiology Dg Chest Portable 1 View  Result Date: 02/14/2019 CLINICAL DATA:  Cough and headache EXAM: PORTABLE CHEST 1 VIEW COMPARISON:  None. FINDINGS: Lungs are clear. Heart size and pulmonary vascularity are normal. No adenopathy. No bone lesions. IMPRESSION: No edema or consolidation. Electronically Signed   By: Bretta Bang III M.D.   On: 02/14/2019 13:22    Procedures Procedures (including critical care time)  Medications Ordered in ED Medications  acetaminophen (TYLENOL) tablet 650 mg (has no administration in time range)     Initial Impression / Assessment and Plan / ED Course  I have reviewed the triage vital signs and the nursing notes.  Pertinent labs & imaging results that were available during my care of the patient were reviewed by me and considered in my medical decision making (see chart for details).         Patient appears to have a respiratory illness.  Given the current pandemic, will send for novel coronavirus testing.  However she is well-appearing.  No increased work of breathing or  hypoxia.  If coronavirus testing is negative, treat as supportive care for viral illness.  Will prescribe antitussives and lozenges.  Final Clinical Impressions(s) / ED Diagnoses   Final diagnoses:  Viral upper respiratory tract infection with cough    ED Discharge Orders         Ordered    benzonatate (TESSALON) 100 MG capsule  3 times daily PRN     02/14/19 1429    phenol-menthol (CEPASTAT) 14.5 MG lozenge  4 times daily PRN     02/14/19 1429           Sherwood Gambler, MD 02/14/19 1435

## 2019-02-14 NOTE — ED Triage Notes (Signed)
Pt c/o flu like sx including cough, HA x 1 week-NAD-steady gait

## 2019-02-15 LAB — NOVEL CORONAVIRUS, NAA (HOSP ORDER, SEND-OUT TO REF LAB; TAT 18-24 HRS): SARS-CoV-2, NAA: NOT DETECTED

## 2019-05-29 DIAGNOSIS — F17210 Nicotine dependence, cigarettes, uncomplicated: Secondary | ICD-10-CM | POA: Diagnosis not present

## 2019-05-29 DIAGNOSIS — O26891 Other specified pregnancy related conditions, first trimester: Secondary | ICD-10-CM | POA: Diagnosis not present

## 2019-05-29 DIAGNOSIS — R102 Pelvic and perineal pain: Secondary | ICD-10-CM | POA: Diagnosis not present

## 2019-05-29 DIAGNOSIS — Z3A01 Less than 8 weeks gestation of pregnancy: Secondary | ICD-10-CM | POA: Diagnosis not present

## 2019-06-03 NOTE — L&D Delivery Note (Signed)
Robyn Stone is a 24 y.o. G3P1011 s/p SVD at [redacted]w[redacted]d. She was admitted for elective IOL.   ROM: 3h 41m with clear fluid GBS Status: Positive Maximum Maternal Temperature: 98.5  Labor Progress and Delivery: She progressed with augmentation (cytotec, FB, pitocin, and AROM) to complete. Called to room and patient was complete and pushing.Pushed less than 2 minutes to deliver. Head delivered LOA. Loose nuchal cord x1 present and reduced. Shoulder and body delivered in usual fashion. Infant with spontaneous cry, placed on mother's abdomen, dried and stimulated. Cord clamping delayed by several minutes then clamped by CNM and cut by Mother in law of patient.  Cord blood drawn. Placenta delivered spontaneously with gentle cord traction, bleeding minimal. Fundus firm with massage and Pitocin. Mom and baby stable prior to transfer to postpartum. She plans on breastfeeding and formula feeding. Postplacental IUD placed for birth control.   Delivery Note At 3:06 AM a viable and healthy female was delivered via Vaginal, Spontaneous (Presentation:  LOA ).  APGAR: 8,9 ; weight pending  .   Placenta delivered spontaneously with gentle cord traction. 3V Cord: with the following complications: None  Anesthesia: Epidural Episiotomy: None Lacerations: None Suture Repair: none Est. Blood Loss (mL): 50  Mom to postpartum.  Baby to Couplet care / Skin to Skin.  Sharyon Cable CNM 01/15/2020, 3:24 AM

## 2019-06-21 DIAGNOSIS — H5213 Myopia, bilateral: Secondary | ICD-10-CM | POA: Diagnosis not present

## 2019-06-21 DIAGNOSIS — H52223 Regular astigmatism, bilateral: Secondary | ICD-10-CM | POA: Diagnosis not present

## 2019-06-23 ENCOUNTER — Ambulatory Visit (INDEPENDENT_AMBULATORY_CARE_PROVIDER_SITE_OTHER): Payer: Medicaid Other

## 2019-06-23 DIAGNOSIS — Z3480 Encounter for supervision of other normal pregnancy, unspecified trimester: Secondary | ICD-10-CM

## 2019-06-23 DIAGNOSIS — Z3490 Encounter for supervision of normal pregnancy, unspecified, unspecified trimester: Secondary | ICD-10-CM | POA: Diagnosis not present

## 2019-06-23 MED ORDER — BLOOD PRESSURE KIT: 1.0000 | PACK | Freq: Once | Status: AC

## 2019-06-23 NOTE — Progress Notes (Signed)
Virtual Visit via Telephone Note  I connected with Robyn Stone on 06/23/19 at 10:30 AM EST by telephone and verified that I am speaking with the correct person using two identifiers.   I discussed the limitations, risks, security and privacy concerns of performing an evaluation and management service by telephone and the availability of in person appointments. I also discussed with the patient that there may be a patient responsible charge related to this service. The patient expressed understanding and agreed to proceed.   OB History  Gravida Para Term Preterm AB Living  '3 1 1     1  ' SAB TAB Ectopic Multiple Live Births        0 1    # Outcome Date GA Lbr Len/2nd Weight Sex Delivery Anes PTL Lv  3 Current           2 Term 08/29/14 [id number]:00 / 00:25 5 lb 7.1 oz (2.469 kg) M Vag-Spont EPI  LIV  1 Gravida                Assessment and Plan:   Past and current medical history reviewed with pt.  BP kit and babyscripts was ordered 06/23/19.    Follow Up Instructions:   Pt advised to keep f/u on 06/30/19.    I discussed the assessment and treatment plan with the patient. The patient was provided an opportunity to ask questions and all were answered. The patient agreed with the plan and demonstrated an understanding of the instructions.   The patient was advised to call back or seek an in-person evaluation if the symptoms worsen or if the condition fails to improve as anticipated.  I provided 15 minutes of non-face-to-face time during this encounter.   EDelrae Alfred CMA

## 2019-07-07 DIAGNOSIS — Z3A12 12 weeks gestation of pregnancy: Secondary | ICD-10-CM | POA: Diagnosis not present

## 2019-07-07 DIAGNOSIS — O26891 Other specified pregnancy related conditions, first trimester: Secondary | ICD-10-CM | POA: Diagnosis not present

## 2019-07-07 DIAGNOSIS — R109 Unspecified abdominal pain: Secondary | ICD-10-CM | POA: Diagnosis not present

## 2019-07-07 DIAGNOSIS — F17210 Nicotine dependence, cigarettes, uncomplicated: Secondary | ICD-10-CM | POA: Diagnosis not present

## 2019-07-07 DIAGNOSIS — S0990XA Unspecified injury of head, initial encounter: Secondary | ICD-10-CM | POA: Diagnosis not present

## 2019-07-07 DIAGNOSIS — E860 Dehydration: Secondary | ICD-10-CM | POA: Diagnosis not present

## 2019-07-07 DIAGNOSIS — S40012A Contusion of left shoulder, initial encounter: Secondary | ICD-10-CM | POA: Diagnosis not present

## 2019-07-07 DIAGNOSIS — O9A211 Injury, poisoning and certain other consequences of external causes complicating pregnancy, first trimester: Secondary | ICD-10-CM | POA: Diagnosis not present

## 2019-07-07 DIAGNOSIS — Z79899 Other long term (current) drug therapy: Secondary | ICD-10-CM | POA: Diagnosis not present

## 2019-07-07 DIAGNOSIS — S299XXA Unspecified injury of thorax, initial encounter: Secondary | ICD-10-CM | POA: Diagnosis not present

## 2019-07-07 DIAGNOSIS — S4992XA Unspecified injury of left shoulder and upper arm, initial encounter: Secondary | ICD-10-CM | POA: Diagnosis not present

## 2019-07-12 ENCOUNTER — Other Ambulatory Visit: Payer: Self-pay

## 2019-07-12 DIAGNOSIS — Z3200 Encounter for pregnancy test, result unknown: Secondary | ICD-10-CM

## 2019-07-13 ENCOUNTER — Encounter: Payer: Self-pay | Admitting: Obstetrics and Gynecology

## 2019-07-13 ENCOUNTER — Other Ambulatory Visit (HOSPITAL_COMMUNITY)
Admission: RE | Admit: 2019-07-13 | Discharge: 2019-07-13 | Disposition: A | Discharge status: Home / Self Care | Payer: Medicaid Other | Source: Ambulatory Visit | Attending: Obstetrics and Gynecology | Admitting: Obstetrics and Gynecology

## 2019-07-13 ENCOUNTER — Ambulatory Visit (INDEPENDENT_AMBULATORY_CARE_PROVIDER_SITE_OTHER): Payer: Medicaid Other

## 2019-07-13 ENCOUNTER — Ambulatory Visit (INDEPENDENT_AMBULATORY_CARE_PROVIDER_SITE_OTHER): Payer: Medicaid Other | Admitting: Obstetrics and Gynecology

## 2019-07-13 ENCOUNTER — Ambulatory Visit: Payer: Medicaid Other

## 2019-07-13 ENCOUNTER — Other Ambulatory Visit: Payer: Self-pay

## 2019-07-13 ENCOUNTER — Other Ambulatory Visit: Payer: Medicaid Other

## 2019-07-13 VITALS — BP 102/63 | HR 83 | Temp 98.3°F | Wt 162.0 lb

## 2019-07-13 DIAGNOSIS — O468X1 Other antepartum hemorrhage, first trimester: Secondary | ICD-10-CM | POA: Insufficient documentation

## 2019-07-13 DIAGNOSIS — Z3481 Encounter for supervision of other normal pregnancy, first trimester: Secondary | ICD-10-CM | POA: Diagnosis not present

## 2019-07-13 DIAGNOSIS — Z3482 Encounter for supervision of other normal pregnancy, second trimester: Secondary | ICD-10-CM | POA: Diagnosis not present

## 2019-07-13 DIAGNOSIS — A749 Chlamydial infection, unspecified: Secondary | ICD-10-CM | POA: Insufficient documentation

## 2019-07-13 DIAGNOSIS — O98811 Other maternal infectious and parasitic diseases complicating pregnancy, first trimester: Secondary | ICD-10-CM | POA: Insufficient documentation

## 2019-07-13 DIAGNOSIS — Z3480 Encounter for supervision of other normal pregnancy, unspecified trimester: Secondary | ICD-10-CM | POA: Diagnosis not present

## 2019-07-13 DIAGNOSIS — O26841 Uterine size-date discrepancy, first trimester: Secondary | ICD-10-CM | POA: Diagnosis not present

## 2019-07-13 DIAGNOSIS — O09291 Supervision of pregnancy with other poor reproductive or obstetric history, first trimester: Secondary | ICD-10-CM

## 2019-07-13 DIAGNOSIS — Z3491 Encounter for supervision of normal pregnancy, unspecified, first trimester: Secondary | ICD-10-CM | POA: Diagnosis not present

## 2019-07-13 DIAGNOSIS — Z3200 Encounter for pregnancy test, result unknown: Secondary | ICD-10-CM

## 2019-07-13 DIAGNOSIS — Z3A12 12 weeks gestation of pregnancy: Secondary | ICD-10-CM

## 2019-07-13 DIAGNOSIS — Z3A13 13 weeks gestation of pregnancy: Secondary | ICD-10-CM

## 2019-07-13 DIAGNOSIS — O418X10 Other specified disorders of amniotic fluid and membranes, first trimester, not applicable or unspecified: Secondary | ICD-10-CM | POA: Insufficient documentation

## 2019-07-13 MED ORDER — AZITHROMYCIN 500 MG PO TABS: 1000.0000 mg | ORAL_TABLET | Freq: Once | ORAL | 0 refills | Status: AC

## 2019-07-13 MED ORDER — ASPIRIN EC 81 MG PO TBEC: 81.0000 mg | DELAYED_RELEASE_TABLET | Freq: Every day | ORAL | 1 refills | Status: DC

## 2019-07-13 NOTE — Progress Notes (Signed)
Last pap 06/20/18

## 2019-07-13 NOTE — Progress Notes (Signed)
aa  History:   Robyn Stone is a 24 y.o. G3P1001 at [redacted]w[redacted]d by LMP being seen today for her first obstetrical visit.  Her obstetrical history is significant for pre-eclampsia. Patient does intend to breast feed. Pregnancy history fully reviewed.  Patient reports no complaints. 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 Medical history non-contributory    Past Surgical History:  Procedure Laterality Date  . NO PAST SURGERIES     Family History  Problem Relation Age of Onset  . Hypertension Mother    Social History   Tobacco Use  . Smoking status: Current Some Day Smoker    Types: Cigarettes  . Smokeless tobacco: Never Used  . Tobacco comment: 1/2 day  Substance Use Topics  . Alcohol use: Yes    Alcohol/week: 0.0 standard drinks    Comment: occ  . Drug use: Yes    Types: Marijuana   No Known Allergies Current Outpatient Medications on File Prior to Visit  Medication Sig Dispense Refill  . Prenatal Vit-Fe Fumarate-FA (PRENATAL COMPLETE) 14-0.4 MG TABS Take 1 tablet by mouth daily. 60 each 0   No current facility-administered medications on file prior to visit.    Review of Systems Pertinent items noted in HPI and remainder of comprehensive ROS otherwise negative. Physical Exam:   Vitals:   07/13/19 0956  BP: 102/63  Pulse: 83  Temp: 98.3 F (36.8 C)  Weight: 162 lb (73.5 kg)      BP 102/63   Pulse 83   Temp 98.3 F (36.8 C)   Wt 162 lb (73.5 kg)   LMP 04/16/2019   BMI 29.63 kg/m  CONSTITUTIONAL: Well-developed, well-nourished female in no acute distress.   HENT:  Normocephalic, atraumatic, External right and left ear normal. Oropharynx is clear and moist EYES: Conjunctivae and EOM are normal. Pupils are equal, round, and reactive to light. No scleral icterus.  NECK: Normal range of motion, supple, no masses.  Normal thyroid.  SKIN: Skin is warm and dry. No rash noted. Not diaphoretic. No erythema. No pallor. MUSCULOSKELETAL: Normal range of motion. No tenderness.  No cyanosis, clubbing, or edema.  2+ distal pulses. NEUROLOGIC: Alert and oriented to person, place, and time. Normal reflexes, muscle tone coordination.  PSYCHIATRIC: Normal mood and affect. Normal behavior. Normal judgment and thought content. CARDIOVASCULAR: Normal heart rate noted, regular rhythm RESPIRATORY: Clear to auscultation bilaterally. Effort and breath sounds normal, no problems with respiration noted. BREASTS: Symmetric in size. No masses, tenderness, skin changes, nipple drainage, or lymphadenopathy bilaterally. ABDOMEN: Soft, no distention noted.  No tenderness, rebound or guarding.  PELVIC: Normal appearing external genitalia and urethral meatus; normal appearing vaginal mucosa and cervix.  No abnormal discharge noted.  Pap smear obtained.  Normal uterine size, no other palpable masses, no uterine or adnexal tenderness.  Assessment:    Pregnancy:  G3P1001 Patient Active Problem List   Diagnosis Date Noted  . Supervision of other normal pregnancy, antepartum 06/23/2019  . Pre-eclampsia 08/24/2014  . Cannabis abuse 04/05/2014     Plan:    1. Supervision of other normal pregnancy, antepartum  - US OB Comp Less 14 Wks; Future - Babyscripts Schedule Optimization - Obstetric panel - HIV antibody (with reflex) - Culture, OB Urine - Urine cytology ancillary only([place]) - Genetic Screening - HgB A1c - Protein / creatinine ratio, urine - Cytology - PAP( [place])  2. Hx of preeclampsia, prior pregnancy, currently pregnant, first trimester Rx: ASA   3.  Chlamydia infection affecting pregnancy in first trimester  - Cytology - PAP( [place]) - Rx: Azithromycin.    Initial labs drawn. Continue prenatal vitamins. Genetic Screening discussed, NIPS: requested. Ultrasound discussed; fetal anatomic survey: requested. Problem list reviewed and updated. The nature of [facility] with multiple MDs and other Advanced Practice Providers was explained to patient; also emphasized that residents, students are part of our team. Routine obstetric precautions reviewed. No follow-ups on file.[email or web address]    , , NP    Faculty Practice Center for Harolyn Rutherford, [facility]

## 2019-07-14 LAB — OBSTETRIC PANEL
Absolute Monocytes: 475 cells/uL (ref 200–950)
Antibody Screen: NOT DETECTED
Basophils Absolute: 20 cells/uL (ref 0–200)
Basophils Relative: 0.3 %
Eosinophils Absolute: 99 cells/uL (ref 15–500)
Eosinophils Relative: 1.5 %
HCT: 34.6 % — ABNORMAL LOW (ref 35.0–45.0)
Hemoglobin: 11.8 g/dL (ref 11.7–15.5)
Hepatitis B Surface Ag: NONREACTIVE
Lymphs Abs: 1729 cells/uL (ref 850–3900)
MCH: 29.8 pg (ref 27.0–33.0)
MCHC: 34.1 g/dL (ref 32.0–36.0)
MCV: 87.4 fL (ref 80.0–100.0)
MPV: 13.2 fL — ABNORMAL HIGH (ref 7.5–12.5)
Monocytes Relative: 7.2 %
Neutro Abs: 4277 cells/uL (ref 1500–7800)
Neutrophils Relative %: 64.8 %
Platelets: 233 10*3/uL (ref 140–400)
RBC: 3.96 10*6/uL (ref 3.80–5.10)
RDW: 16 % — ABNORMAL HIGH (ref 11.0–15.0)
RPR Ser Ql: NONREACTIVE
Rubella: 1.01 Index
Total Lymphocyte: 26.2 %
WBC: 6.6 10*3/uL (ref 3.8–10.8)

## 2019-07-14 LAB — URINE CYTOLOGY ANCILLARY ONLY
Chlamydia: POSITIVE — AB
Comment: NEGATIVE
Comment: NORMAL
Neisseria Gonorrhea: NEGATIVE

## 2019-07-14 LAB — CYTOLOGY - PAP
Chlamydia: POSITIVE — AB
Comment: NEGATIVE
Comment: NORMAL
Diagnosis: NEGATIVE
Neisseria Gonorrhea: NEGATIVE

## 2019-07-14 LAB — PROTEIN / CREATININE RATIO, URINE
Creatinine, Urine: 44 mg/dL (ref 20–275)
Protein/Creat Ratio: 91 mg/g creat (ref 21–161)
Protein/Creatinine Ratio: 0.091 mg/mg creat (ref 0.021–0.16)
Total Protein, Urine: 4 mg/dL — ABNORMAL LOW (ref 5–24)

## 2019-07-14 LAB — HEMOGLOBIN A1C
Hgb A1c MFr Bld: 5.1 % of total Hgb (ref <5.7)
Mean Plasma Glucose: 100 (calc)
eAG (mmol/L): 5.5 (calc)

## 2019-07-14 LAB — HIV ANTIBODY (ROUTINE TESTING W REFLEX): HIV 1&2 Ab, 4th Generation: NONREACTIVE

## 2019-07-15 LAB — URINE CULTURE, OB REFLEX

## 2019-07-15 LAB — CULTURE, OB URINE

## 2019-07-27 ENCOUNTER — Encounter: Payer: Self-pay | Admitting: *Deleted

## 2019-07-28 DIAGNOSIS — H5203 Hypermetropia, bilateral: Secondary | ICD-10-CM | POA: Diagnosis not present

## 2019-07-28 DIAGNOSIS — H52203 Unspecified astigmatism, bilateral: Secondary | ICD-10-CM | POA: Diagnosis not present

## 2019-08-10 ENCOUNTER — Ambulatory Visit (INDEPENDENT_AMBULATORY_CARE_PROVIDER_SITE_OTHER): Payer: Medicaid Other | Admitting: Obstetrics and Gynecology

## 2019-08-10 ENCOUNTER — Other Ambulatory Visit: Payer: Self-pay

## 2019-08-10 ENCOUNTER — Other Ambulatory Visit (HOSPITAL_COMMUNITY)
Admission: RE | Admit: 2019-08-10 | Discharge: 2019-08-10 | Disposition: A | Discharge status: Home / Self Care | Payer: Medicaid Other | Source: Ambulatory Visit | Attending: Obstetrics and Gynecology | Admitting: Obstetrics and Gynecology

## 2019-08-10 VITALS — BP 104/67 | HR 90 | Temp 98.4°F | Wt 162.0 lb

## 2019-08-10 DIAGNOSIS — R300 Dysuria: Secondary | ICD-10-CM | POA: Diagnosis not present

## 2019-08-10 DIAGNOSIS — Z3480 Encounter for supervision of other normal pregnancy, unspecified trimester: Secondary | ICD-10-CM | POA: Diagnosis not present

## 2019-08-10 DIAGNOSIS — O98811 Other maternal infectious and parasitic diseases complicating pregnancy, first trimester: Secondary | ICD-10-CM | POA: Diagnosis not present

## 2019-08-10 DIAGNOSIS — A749 Chlamydial infection, unspecified: Secondary | ICD-10-CM | POA: Diagnosis not present

## 2019-08-10 DIAGNOSIS — O98812 Other maternal infectious and parasitic diseases complicating pregnancy, second trimester: Secondary | ICD-10-CM

## 2019-08-10 DIAGNOSIS — Z3A16 16 weeks gestation of pregnancy: Secondary | ICD-10-CM

## 2019-08-10 NOTE — Progress Notes (Signed)
   PRENATAL VISIT NOTE  Subjective:  Robyn Stone is a 24 y.o. G3P1001 at [redacted]w[redacted]d being seen today for ongoing prenatal care.  She is currently monitored for the following issues for this low-risk pregnancy and has Cannabis abuse; Pre-eclampsia; Supervision of other normal pregnancy, antepartum; Chlamydia infection affecting pregnancy in first trimester; Hx of preeclampsia, prior pregnancy, currently pregnant, first trimester; and Subchorionic hematoma in first trimester on their problem list.  Patient reports no complaints.  Contractions: Not present. Vag. Bleeding: None.  Movement: Present. Denies leaking of fluid.   The following portions of the patient's history were reviewed and updated as appropriate: allergies, current medications, past family history, past medical history, past social history, past surgical history and problem list.   Objective:   Vitals:   08/10/19 0913  BP: 104/67  Pulse: 90  Temp: 98.4 F (36.9 C)  Weight: 162 lb (73.5 kg)    Fetal Status: Fetal Heart Rate (bpm): 137   Movement: Present     General:  Alert, oriented and cooperative. Patient is in no acute distress.  Skin: Skin is warm and dry. No rash noted.   Cardiovascular: Normal heart rate noted  Respiratory: Normal respiratory effort, no problems with respiration noted  Abdomen: Soft, gravid, appropriate for gestational age.  Pain/Pressure: Absent     Pelvic: Cervical exam deferred        Extremities: Normal range of motion.  Edema: None  Mental Status: Normal mood and affect. Normal behavior. Normal judgment and thought content.   Assessment and Plan:  Pregnancy: G3P1001 at [redacted]w[redacted]d  1. Supervision of other normal pregnancy, antepartum  - Alpha fetoprotein, maternal - [place] MFM OB COMP + 14 WK; Future - Culture, OB Urine  2. Chlamydia infection affecting pregnancy in first trimester  - TOC done today.  - GC/Chlamydia probe amp ([place])not at [facility]  3. Dysuria  - Culture, OB  Urine  Preterm labor symptoms and general obstetric precautions including but not limited to vaginal bleeding, contractions, leaking of fluid and fetal movement were reviewed in detail with the patient. Please refer to After Visit Summary for other counseling recommendations.   Return in about 4 weeks (around 09/07/2019) for virtual is ok. .  Future Appointments  Date Time Provider Department Center  08/26/2019  2:15 PM [facility] [place] 4 [facility]  09/07/2019  9:50 AM Shonika Kolasinski, Harolyn Rutherford, NP [facility]    Venia Carbon, NP

## 2019-08-11 LAB — ALPHA FETOPROTEIN, MATERNAL
AFP MoM: 1.01
AFP, Serum: 32.1 ng/mL
Calc'd Gestational Age: 16.6 weeks
Maternal Wt: 162 [lb_av]
Risk for ONTD: <1
Twins-AFP: 1

## 2019-08-11 LAB — GC/CHLAMYDIA PROBE AMP (~~LOC~~) NOT AT ARMC
Chlamydia: NEGATIVE
Comment: NEGATIVE
Comment: NORMAL
Neisseria Gonorrhea: NEGATIVE

## 2019-08-12 LAB — CULTURE, OB URINE

## 2019-08-12 LAB — URINE CULTURE, OB REFLEX

## 2019-08-18 DIAGNOSIS — R000 Tachycardia, unspecified: Secondary | ICD-10-CM | POA: Diagnosis not present

## 2019-08-18 DIAGNOSIS — M25531 Pain in right wrist: Secondary | ICD-10-CM | POA: Diagnosis not present

## 2019-08-18 DIAGNOSIS — O26892 Other specified pregnancy related conditions, second trimester: Secondary | ICD-10-CM | POA: Diagnosis not present

## 2019-08-18 DIAGNOSIS — R52 Pain, unspecified: Secondary | ICD-10-CM | POA: Diagnosis not present

## 2019-08-18 DIAGNOSIS — Y999 Unspecified external cause status: Secondary | ICD-10-CM | POA: Diagnosis not present

## 2019-08-18 DIAGNOSIS — R1084 Generalized abdominal pain: Secondary | ICD-10-CM | POA: Diagnosis not present

## 2019-08-18 DIAGNOSIS — Z3A17 17 weeks gestation of pregnancy: Secondary | ICD-10-CM | POA: Diagnosis not present

## 2019-08-18 DIAGNOSIS — R109 Unspecified abdominal pain: Secondary | ICD-10-CM | POA: Diagnosis not present

## 2019-08-18 DIAGNOSIS — R519 Headache, unspecified: Secondary | ICD-10-CM | POA: Diagnosis not present

## 2019-08-18 DIAGNOSIS — O9A212 Injury, poisoning and certain other consequences of external causes complicating pregnancy, second trimester: Secondary | ICD-10-CM | POA: Diagnosis not present

## 2019-08-18 DIAGNOSIS — Y92410 Unspecified street and highway as the place of occurrence of the external cause: Secondary | ICD-10-CM | POA: Diagnosis not present

## 2019-08-18 DIAGNOSIS — Z3A18 18 weeks gestation of pregnancy: Secondary | ICD-10-CM | POA: Diagnosis not present

## 2019-08-18 DIAGNOSIS — R55 Syncope and collapse: Secondary | ICD-10-CM | POA: Diagnosis not present

## 2019-08-18 DIAGNOSIS — O0990 Supervision of high risk pregnancy, unspecified, unspecified trimester: Secondary | ICD-10-CM | POA: Diagnosis not present

## 2019-08-19 ENCOUNTER — Telehealth: Payer: Self-pay | Admitting: *Deleted
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
## 2019-08-22 ENCOUNTER — Inpatient Hospital Stay (HOSPITAL_COMMUNITY)
Admission: AD | Admit: 2019-08-22 | Discharge: 2019-08-22 | Discharge status: Against Medical Advice | Payer: Medicaid Other | Attending: Obstetrics and Gynecology | Admitting: Obstetrics and Gynecology

## 2019-08-22 ENCOUNTER — Encounter (HOSPITAL_COMMUNITY): Payer: Self-pay | Admitting: Obstetrics and Gynecology

## 2019-08-22 ENCOUNTER — Telehealth: Payer: Self-pay | Admitting: *Deleted

## 2019-08-22 ENCOUNTER — Other Ambulatory Visit: Payer: Self-pay

## 2019-08-22 DIAGNOSIS — R109 Unspecified abdominal pain: Secondary | ICD-10-CM | POA: Diagnosis not present

## 2019-08-22 DIAGNOSIS — Z5329 Procedure and treatment not carried out because of patient's decision for other reasons: Secondary | ICD-10-CM | POA: Insufficient documentation

## 2019-08-22 DIAGNOSIS — R102 Pelvic and perineal pain: Secondary | ICD-10-CM

## 2019-08-22 HISTORY — DX: Anxiety disorder, unspecified: F41.9

## 2019-08-22 HISTORY — DX: Gestational (pregnancy-induced) hypertension without significant proteinuria, unspecified trimester: O13.9

## 2019-08-22 HISTORY — DX: Headache, unspecified: R51.9

## 2019-08-22 LAB — URINALYSIS, ROUTINE W REFLEX MICROSCOPIC
Bilirubin Urine: NEGATIVE
Glucose, UA: NEGATIVE mg/dL
Hgb urine dipstick: NEGATIVE
Ketones, ur: NEGATIVE mg/dL
Nitrite: NEGATIVE
Protein, ur: NEGATIVE mg/dL
Specific Gravity, Urine: 1.003 — ABNORMAL LOW (ref 1.005–1.030)
pH: 8 (ref 5.0–8.0)

## 2019-08-22 NOTE — MAU Provider Note (Signed)
RN reported to provider that patient could only stay in MAU until 1pm. Provider to bedside to discuss with patient average length of stay in MAU, possible testing/evaluation based on chief complaint and vital signs. Discussed AMA paperwork and explained to patient that MAU is open 24/7 and she is also welcome to return at any time. Patient states she has a dentist appointment at 2pm and needs to "do some things" before the appointment and requests to sign out AMA. RN to give AMA paperwork to patient. Patient states she will return later today.  Marylen Ponto, NP  12:56 PM 08/22/2019
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
## 2019-08-26 ENCOUNTER — Other Ambulatory Visit: Payer: Self-pay

## 2019-08-26 ENCOUNTER — Ambulatory Visit (HOSPITAL_COMMUNITY)
Admission: RE | Admit: 2019-08-26 | Discharge: 2019-08-26 | Disposition: A | Discharge status: Home / Self Care | Payer: Medicaid Other | Source: Ambulatory Visit | Attending: Obstetrics and Gynecology | Admitting: Obstetrics and Gynecology

## 2019-08-26 DIAGNOSIS — Z3A18 18 weeks gestation of pregnancy: Secondary | ICD-10-CM | POA: Diagnosis not present

## 2019-08-26 DIAGNOSIS — Z363 Encounter for antenatal screening for malformations: Secondary | ICD-10-CM | POA: Diagnosis not present

## 2019-08-26 DIAGNOSIS — Z3480 Encounter for supervision of other normal pregnancy, unspecified trimester: Secondary | ICD-10-CM | POA: Diagnosis not present

## 2019-08-29 ENCOUNTER — Other Ambulatory Visit (HOSPITAL_COMMUNITY): Payer: Self-pay | Admitting: *Deleted

## 2019-08-29 DIAGNOSIS — Z362 Encounter for other antenatal screening follow-up: Secondary | ICD-10-CM

## 2019-09-07 ENCOUNTER — Ambulatory Visit (INDEPENDENT_AMBULATORY_CARE_PROVIDER_SITE_OTHER): Payer: Medicaid Other | Admitting: Obstetrics and Gynecology

## 2019-09-08 ENCOUNTER — Other Ambulatory Visit: Payer: Self-pay

## 2019-09-08 ENCOUNTER — Encounter: Payer: Self-pay | Admitting: Obstetrics and Gynecology

## 2019-09-08 ENCOUNTER — Ambulatory Visit (INDEPENDENT_AMBULATORY_CARE_PROVIDER_SITE_OTHER): Payer: Medicaid Other | Admitting: Obstetrics and Gynecology

## 2019-09-08 VITALS — BP 111/68 | HR 76 | Temp 98.5°F | Wt 162.0 lb

## 2019-09-08 DIAGNOSIS — Z3480 Encounter for supervision of other normal pregnancy, unspecified trimester: Secondary | ICD-10-CM

## 2019-09-08 DIAGNOSIS — O09291 Supervision of pregnancy with other poor reproductive or obstetric history, first trimester: Secondary | ICD-10-CM

## 2019-09-08 MED ORDER — PANTOPRAZOLE SODIUM 20 MG PO TBEC: 20.0000 mg | DELAYED_RELEASE_TABLET | Freq: Every day | ORAL | 4 refills | Status: DC

## 2019-09-08 MED ORDER — DOCUSATE SODIUM 100 MG PO CAPS: 100.0000 mg | ORAL_CAPSULE | Freq: Two times a day (BID) | ORAL | 2 refills | Status: DC | PRN

## 2019-09-08 NOTE — Progress Notes (Signed)
r 

## 2019-09-08 NOTE — Progress Notes (Signed)
   PRENATAL VISIT NOTE  Subjective:  Robyn Stone is a 24 y.o. G3P1011 at [redacted]w[redacted]d being seen today for ongoing prenatal care.  She is currently monitored for the following issues for this low-risk pregnancy and has Cannabis abuse; Supervision of other normal pregnancy, antepartum; Chlamydia infection affecting pregnancy in first trimester; Hx of preeclampsia, prior pregnancy, currently pregnant, first trimester; and Subchorionic hematoma in first trimester on their problem list.  Patient reports heartburn and constipation.  Contractions: Not present. Vag. Bleeding: None.  Movement: Present. Denies leaking of fluid.   The following portions of the patient's history were reviewed and updated as appropriate: allergies, current medications, past family history, past medical history, past social history, past surgical history and problem list.   Objective:   Vitals:   09/08/19 1454  BP: 111/68  Pulse: 76  Temp: 98.5 F (36.9 C)  Weight: 162 lb (73.5 kg)    Fetal Status: Fetal Heart Rate (bpm): 150 Fundal Height: 20 cm Movement: Present     General:  Alert, oriented and cooperative. Patient is in no acute distress.  Skin: Skin is warm and dry. No rash noted.   Cardiovascular: Normal heart rate noted  Respiratory: Normal respiratory effort, no problems with respiration noted  Abdomen: Soft, gravid, appropriate for gestational age.  Pain/Pressure: Absent     Pelvic: Cervical exam deferred        Extremities: Normal range of motion.  Edema: None  Mental Status: Normal mood and affect. Normal behavior. Normal judgment and thought content.   Assessment and Plan:  Pregnancy: G3P1011 at [redacted]w[redacted]d 1. Supervision of other normal pregnancy, antepartum Patient is doing well Rx colace and protonix provided Encouraged patient to increase water intake and fiber Follow up anatomy ultrasound 4/22  2. Hx of preeclampsia, prior pregnancy, currently pregnant, first trimester Continue ASA  Preterm  labor symptoms and general obstetric precautions including but not limited to vaginal bleeding, contractions, leaking of fluid and fetal movement were reviewed in detail with the patient. Please refer to After Visit Summary for other counseling recommendations.   Return in about 4 weeks (around 10/06/2019) for in person, ROB.  Future Appointments  Date Time Provider Department Center  09/22/2019  3:45 PM [facility] NURSE [facility]  09/22/2019  3:45 PM [facility] [place] 2 [facility]    Catalina Antigua, MD

## 2019-09-22 ENCOUNTER — Encounter (HOSPITAL_COMMUNITY): Payer: Self-pay

## 2019-09-22 ENCOUNTER — Other Ambulatory Visit: Payer: Self-pay

## 2019-09-22 ENCOUNTER — Ambulatory Visit (HOSPITAL_COMMUNITY): Payer: Medicaid Other | Admitting: *Deleted

## 2019-09-22 ENCOUNTER — Ambulatory Visit (HOSPITAL_COMMUNITY)
Admission: RE | Admit: 2019-09-22 | Discharge: 2019-09-22 | Disposition: A | Discharge status: Home / Self Care | Payer: Medicaid Other | Source: Ambulatory Visit | Attending: Obstetrics and Gynecology | Admitting: Obstetrics and Gynecology

## 2019-09-22 VITALS — BP 118/87 | HR 93 | Temp 97.5°F

## 2019-09-22 DIAGNOSIS — Z362 Encounter for other antenatal screening follow-up: Secondary | ICD-10-CM | POA: Diagnosis not present

## 2019-09-22 DIAGNOSIS — Z3A22 22 weeks gestation of pregnancy: Secondary | ICD-10-CM | POA: Diagnosis not present

## 2019-09-22 DIAGNOSIS — Z8759 Personal history of other complications of pregnancy, childbirth and the puerperium: Secondary | ICD-10-CM | POA: Diagnosis not present

## 2019-10-06 ENCOUNTER — Encounter: Payer: Self-pay | Admitting: Family Medicine

## 2019-10-06 ENCOUNTER — Encounter: Payer: Medicaid Other | Admitting: Family Medicine

## 2019-10-06 NOTE — Progress Notes (Signed)
Patient did not keep appointment today. She will be called to reschedule.  

## 2019-10-10 NOTE — Progress Notes (Deleted)
Subjective:  Robyn Stone is a 24 y.o. G3P1011 at [redacted]w[redacted]d being seen today for ongoing prenatal care.  She is currently monitored for the following issues for this low-risk pregnancy and has Cannabis abuse; Supervision of other normal pregnancy, antepartum; Chlamydia infection affecting pregnancy in first trimester; Hx of preeclampsia, prior pregnancy, currently pregnant, first trimester; and Subchorionic hematoma in first trimester on their problem list.  Patient reports {sx:14538}.   .  .   . Denies leaking of fluid.   The following portions of the patient's history were reviewed and updated as appropriate: allergies, current medications, past family history, past medical history, past social history, past surgical history and problem list. Problem list updated.  Objective:  There were no vitals filed for this visit.  Fetal Status:           General:  Alert, oriented and cooperative. Patient is in no acute distress.  Skin: Skin is warm and dry. No rash noted.   Cardiovascular: Normal heart rate noted  Respiratory: Normal respiratory effort, no problems with respiration noted  Abdomen: Soft, gravid, appropriate for gestational age.       Pelvic:       {Blank single:19197::"Cervical exam performed","Cervical exam deferred"}        Extremities: Normal range of motion.     Mental Status: Normal mood and affect. Normal behavior. Normal judgment and thought content.   Urinalysis:      Assessment and Plan:  Pregnancy: G3P1011 at [redacted]w[redacted]d  1. Supervision of other normal pregnancy, antepartum *** - discussed contraception, pt elects ***  2. Chlamydia infection affecting pregnancy in first trimester -negative TOC 08/10/2019  3. Hx of preeclampsia, prior pregnancy, currently pregnant, first trimester *** -pt taking ASA daily -BP today ***  4. Cannabis abuse ***  Preterm labor symptoms and general obstetric precautions including but not limited to vaginal bleeding, contractions,  leaking of fluid and fetal movement were reviewed in detail with the patient. Please refer to After Visit Summary for other counseling recommendations.  No follow-ups on file.   Mattilyn Crites, Odie Sera, NP

## 2019-10-11 ENCOUNTER — Encounter: Payer: Medicaid Other | Admitting: Women's Health

## 2019-10-26 ENCOUNTER — Ambulatory Visit (INDEPENDENT_AMBULATORY_CARE_PROVIDER_SITE_OTHER): Payer: Medicaid Other | Admitting: Obstetrics and Gynecology

## 2019-10-26 ENCOUNTER — Other Ambulatory Visit: Payer: Self-pay

## 2019-10-26 ENCOUNTER — Other Ambulatory Visit (HOSPITAL_COMMUNITY)
Admission: RE | Admit: 2019-10-26 | Discharge: 2019-10-26 | Disposition: A | Discharge status: Home / Self Care | Payer: Medicaid Other | Source: Ambulatory Visit | Attending: Obstetrics and Gynecology | Admitting: Obstetrics and Gynecology

## 2019-10-26 VITALS — BP 107/68 | HR 69 | Wt 163.0 lb

## 2019-10-26 DIAGNOSIS — Z23 Encounter for immunization: Secondary | ICD-10-CM

## 2019-10-26 DIAGNOSIS — O99891 Other specified diseases and conditions complicating pregnancy: Secondary | ICD-10-CM

## 2019-10-26 DIAGNOSIS — Z3A27 27 weeks gestation of pregnancy: Secondary | ICD-10-CM

## 2019-10-26 DIAGNOSIS — N898 Other specified noninflammatory disorders of vagina: Secondary | ICD-10-CM

## 2019-10-26 DIAGNOSIS — Z3480 Encounter for supervision of other normal pregnancy, unspecified trimester: Secondary | ICD-10-CM | POA: Diagnosis not present

## 2019-10-26 LAB — POCT URINALYSIS DIPSTICK
Bilirubin, UA: NEGATIVE
Blood, UA: NEGATIVE
Glucose, UA: NEGATIVE
Ketones, POC UA: NEGATIVE mg/dL
Leukocytes, UA: NEGATIVE
Nitrite, UA: NEGATIVE
Protein, UA: NEGATIVE
Spec Grav, UA: 1.015 (ref 1.010–1.025)
Urobilinogen, UA: 0.2 E.U./dL
pH, UA: 5 (ref 5.0–8.0)

## 2019-10-26 MED ORDER — COMFORT FIT MATERNITY SUPP LG MISC: 1.0000 | Freq: Every day | 0 refills | Status: DC | PRN

## 2019-10-26 NOTE — Progress Notes (Signed)
   PRENATAL VISIT NOTE  Subjective:  Robyn Stone is a 24 y.o. G3P1011 at [redacted]w[redacted]d being seen today for ongoing prenatal care.  She is currently monitored for the following issues for this low-risk pregnancy and has Cannabis abuse; Supervision of other normal pregnancy, antepartum; Chlamydia infection affecting pregnancy in first trimester; Hx of preeclampsia, prior pregnancy, currently pregnant, first trimester; and Subchorionic hematoma in first trimester on their problem list.  Patient reports Pelvic pain that is worse with movement. .  Contractions: Not present. Vag. Bleeding: None.  Movement: Present. Denies leaking of fluid.   The following portions of the patient's history were reviewed and updated as appropriate: allergies, current medications, past family history, past medical history, past social history, past surgical history and problem list.   Objective:   Vitals:   10/26/19 0857  BP: 107/68  Pulse: 69  Weight: 163 lb (73.9 kg)    Fetal Status: Fetal Heart Rate (bpm): 131 Fundal Height: 27 cm Movement: Present     General:  Alert, oriented and cooperative. Patient is in no acute distress.  Skin: Skin is warm and dry. No rash noted.   Cardiovascular: Normal heart rate noted  Respiratory: Normal respiratory effort, no problems with respiration noted  Abdomen: Soft, gravid, appropriate for gestational age.  Pain/Pressure: Absent     Pelvic: Cervical exam performed in the presence of a chaperone Dilation: Closed      Extremities: Normal range of motion.  Edema: None  Mental Status: Normal mood and affect. Normal behavior. Normal judgment and thought content.   Assessment and Plan:  Pregnancy: G3P1011 at [redacted]w[redacted]d  1. Supervision of other normal pregnancy, antepartum * - 2Hr GTT w/ 1 Hr [place] 75 g - HIV antibody (with reflex) - CBC - RPR - Urine drugs of abuse scrn w alc, routine ([facility]) -Positive pelvic pain that is worse with moving  prescription given for pelvic support belt.  2. Vaginal irritation  - POCT Urinalysis Dipstick - Cervicovaginal ancillary only( [place])  Preterm labor symptoms and general obstetric precautions including but not limited to vaginal bleeding, contractions, leaking of fluid and fetal movement were reviewed in detail with the patient. Please refer to After Visit Summary for other counseling recommendations.   No follow-ups on file.  Future Appointments  Date Time Provider Department Center  11/23/2019  9:30 AM Alek Poncedeleon, Harolyn Rutherford, NP [facility]    Venia Carbon, NP

## 2019-10-27 ENCOUNTER — Other Ambulatory Visit: Payer: Self-pay | Admitting: Obstetrics and Gynecology

## 2019-10-27 LAB — CERVICOVAGINAL ANCILLARY ONLY
Bacterial Vaginitis (gardnerella): POSITIVE — AB
Candida Glabrata: NEGATIVE
Candida Vaginitis: POSITIVE — AB
Chlamydia: NEGATIVE
Comment: NEGATIVE
Comment: NEGATIVE
Comment: NEGATIVE
Comment: NEGATIVE
Comment: NEGATIVE
Comment: NORMAL
Neisseria Gonorrhea: NEGATIVE
Trichomonas: NEGATIVE

## 2019-10-27 MED ORDER — TERCONAZOLE 0.4 % VA CREA: 1.0000 | TOPICAL_CREAM | Freq: Every day | VAGINAL | 0 refills | Status: DC

## 2019-10-27 MED ORDER — METRONIDAZOLE 500 MG PO TABS: 500.0000 mg | ORAL_TABLET | Freq: Two times a day (BID) | ORAL | 0 refills | Status: AC

## 2019-11-07 LAB — URINE DRUGS OF ABUSE SCREEN W ALC, ROUTINE (REF LAB)
Amphetamines, Urine: NEGATIVE ng/mL
Barbiturate Quant, Ur: NEGATIVE ng/mL
Benzodiazepine Quant, Ur: NEGATIVE ng/mL
Cocaine (Metab.): NEGATIVE ng/mL
Ethanol, Urine: NEGATIVE %
Methadone Screen, Urine: NEGATIVE ng/mL
Opiate Quant, Ur: NEGATIVE ng/mL
PCP Quant, Ur: NEGATIVE ng/mL
Propoxyphene: NEGATIVE ng/mL

## 2019-11-07 LAB — PANEL 799049
CARBOXY THC GC/MS CONF: >300 ng/mL
Cannabinoid GC/MS, Ur: POSITIVE — AB

## 2019-11-21 DIAGNOSIS — O99333 Smoking (tobacco) complicating pregnancy, third trimester: Secondary | ICD-10-CM | POA: Diagnosis not present

## 2019-11-21 DIAGNOSIS — O4703 False labor before 37 completed weeks of gestation, third trimester: Secondary | ICD-10-CM | POA: Diagnosis not present

## 2019-11-21 DIAGNOSIS — Z3A31 31 weeks gestation of pregnancy: Secondary | ICD-10-CM | POA: Diagnosis not present

## 2019-11-23 ENCOUNTER — Other Ambulatory Visit: Payer: Self-pay

## 2019-11-23 ENCOUNTER — Ambulatory Visit (INDEPENDENT_AMBULATORY_CARE_PROVIDER_SITE_OTHER): Payer: Medicaid Other | Admitting: Obstetrics and Gynecology

## 2019-11-23 VITALS — BP 116/67 | HR 88 | Wt 147.1 lb

## 2019-11-23 DIAGNOSIS — Z3483 Encounter for supervision of other normal pregnancy, third trimester: Secondary | ICD-10-CM

## 2019-11-23 DIAGNOSIS — Z3480 Encounter for supervision of other normal pregnancy, unspecified trimester: Secondary | ICD-10-CM

## 2019-11-23 DIAGNOSIS — Z3A31 31 weeks gestation of pregnancy: Secondary | ICD-10-CM

## 2019-11-23 NOTE — Progress Notes (Signed)
   PRENATAL VISIT NOTE  Subjective:  Robyn Stone is a 24 y.o. G3P1011 at [redacted]w[redacted]d being seen today for ongoing prenatal care.  She is currently monitored for the following issues for this high-risk pregnancy and has Cannabis abuse; Supervision of other normal pregnancy, antepartum; Chlamydia infection affecting pregnancy in first trimester; Hx of preeclampsia, prior pregnancy, currently pregnant, first trimester; and Subchorionic hematoma in first trimester on their problem list.  Patient reports Round ligament pain. .  Contractions: Not present. Vag. Bleeding: None.  Movement: Present. Denies leaking of fluid.   The following portions of the patient's history were reviewed and updated as appropriate: allergies, current medications, past family history, past medical history, past social history, past surgical history and problem list.   Objective:   Vitals:   11/23/19 1003  BP: 116/67  Pulse: 88  Weight: 147 lb 1.9 oz (66.7 kg)    Fetal Status: Fetal Heart Rate (bpm): 139 Fundal Height: 30 cm Movement: Present     General:  Alert, oriented and cooperative. Patient is in no acute distress.  Skin: Skin is warm and dry. No rash noted.   Cardiovascular: Normal heart rate noted  Respiratory: Normal respiratory effort, no problems with respiration noted  Abdomen: Soft, gravid, appropriate for gestational age.  Pain/Pressure: Present     Pelvic: Cervical exam deferred        Extremities: Normal range of motion.  Edema: None  Mental Status: Normal mood and affect. Normal behavior. Normal judgment and thought content.   Assessment and Plan:  Pregnancy: G3P1011 at [redacted]w[redacted]d  1. Supervision of other normal pregnancy, antepartum  - missed glucose test and 3rd trimester labs.  - She is fasting today and is agreeable to have those done.  - Glucose Tolerance, 2 Hours w/1 Hour - CBC - HIV antibody (with reflex) - RPR - Was seen at [facility] for round ligament. Did not know that our office does  not deliver at [facility]. Asked about transferring care. Would like to stay with our office.   Preterm labor symptoms and general obstetric precautions including but not limited to vaginal bleeding, contractions, leaking of fluid and fetal movement were reviewed in detail with the patient. Please refer to After Visit Summary for other counseling recommendations.   Return in about 2 weeks (around 12/07/2019) for in person visit .  No future appointments.  Venia Carbon, NP

## 2019-11-24 LAB — CBC
HCT: 32.2 % — ABNORMAL LOW (ref 35.0–45.0)
Hemoglobin: 10.5 g/dL — ABNORMAL LOW (ref 11.7–15.5)
MCH: 26.9 pg — ABNORMAL LOW (ref 27.0–33.0)
MCHC: 32.6 g/dL (ref 32.0–36.0)
MCV: 82.4 fL (ref 80.0–100.0)
MPV: 13.7 fL — ABNORMAL HIGH (ref 7.5–12.5)
Platelets: 269 10*3/uL (ref 140–400)
RBC: 3.91 10*6/uL (ref 3.80–5.10)
RDW: 14.2 % (ref 11.0–15.0)
WBC: 7.8 10*3/uL (ref 3.8–10.8)

## 2019-11-24 LAB — HIV ANTIBODY (ROUTINE TESTING W REFLEX): HIV 1&2 Ab, 4th Generation: NONREACTIVE

## 2019-11-24 LAB — RPR: RPR Ser Ql: NONREACTIVE

## 2019-11-24 LAB — GLUCOSE TOLERANCE, 2 HOURS W/ 1HR
Glucose, 1 hour: 139 mg/dL (ref 65–199)
Glucose, 2 hour: 133 mg/dL (ref 65–139)
Glucose, Fasting: 81 mg/dL (ref 65–99)

## 2019-11-25 ENCOUNTER — Encounter: Payer: Self-pay | Admitting: Obstetrics and Gynecology

## 2019-11-25 DIAGNOSIS — O99019 Anemia complicating pregnancy, unspecified trimester: Secondary | ICD-10-CM | POA: Insufficient documentation

## 2019-11-29 ENCOUNTER — Telehealth: Payer: Self-pay | Admitting: *Deleted

## 2019-11-29 NOTE — Telephone Encounter (Signed)
Left patient a message to call the office about her upcoming appointment on 12/06/2019 with Sharen Counter. Patient had sent a MyChart message about only seeing [employer].

## 2019-12-01 DIAGNOSIS — Z419 Encounter for procedure for purposes other than remedying health state, unspecified: Secondary | ICD-10-CM | POA: Diagnosis not present

## 2019-12-06 ENCOUNTER — Ambulatory Visit (INDEPENDENT_AMBULATORY_CARE_PROVIDER_SITE_OTHER): Payer: Medicaid Other | Admitting: Advanced Practice Midwife

## 2019-12-06 ENCOUNTER — Other Ambulatory Visit: Payer: Self-pay

## 2019-12-06 VITALS — BP 113/77 | HR 92 | Wt 169.0 lb

## 2019-12-06 DIAGNOSIS — O98811 Other maternal infectious and parasitic diseases complicating pregnancy, first trimester: Secondary | ICD-10-CM

## 2019-12-06 DIAGNOSIS — R102 Pelvic and perineal pain unspecified side: Secondary | ICD-10-CM

## 2019-12-06 DIAGNOSIS — R109 Unspecified abdominal pain: Secondary | ICD-10-CM

## 2019-12-06 DIAGNOSIS — A749 Chlamydial infection, unspecified: Secondary | ICD-10-CM

## 2019-12-06 DIAGNOSIS — O99323 Drug use complicating pregnancy, third trimester: Secondary | ICD-10-CM

## 2019-12-06 DIAGNOSIS — Z3A33 33 weeks gestation of pregnancy: Secondary | ICD-10-CM

## 2019-12-06 DIAGNOSIS — O26893 Other specified pregnancy related conditions, third trimester: Secondary | ICD-10-CM

## 2019-12-06 DIAGNOSIS — F1290 Cannabis use, unspecified, uncomplicated: Secondary | ICD-10-CM

## 2019-12-06 DIAGNOSIS — O26899 Other specified pregnancy related conditions, unspecified trimester: Secondary | ICD-10-CM

## 2019-12-06 DIAGNOSIS — Z3480 Encounter for supervision of other normal pregnancy, unspecified trimester: Secondary | ICD-10-CM

## 2019-12-06 MED ORDER — CYCLOBENZAPRINE HCL 5 MG PO TABS: 5.0000 mg | ORAL_TABLET | Freq: Three times a day (TID) | ORAL | 0 refills | Status: DC | PRN

## 2019-12-06 MED ORDER — COMFORT FIT MATERNITY SUPP LG MISC: 1.0000 | Freq: Every day | 0 refills | Status: DC | PRN

## 2019-12-06 NOTE — Progress Notes (Signed)
   PRENATAL VISIT NOTE  Subjective:  Robyn Stone is a 24 y.o. G3P1011 at [redacted]w[redacted]d being seen today for ongoing prenatal care.  She is currently monitored for the following issues for this low-risk pregnancy and has Cannabis abuse; Supervision of other normal pregnancy, antepartum; Chlamydia infection affecting pregnancy in first trimester; Hx of preeclampsia, prior pregnancy, currently pregnant, first trimester; Subchorionic hematoma in first trimester; and Anemia in pregnancy on their problem list.  Patient reports lower abdominal cramping that radiates into the vagina.  Contractions: Not present. Vag. Bleeding: None.  Movement: Present. Denies leaking of fluid.   The following portions of the patient's history were reviewed and updated as appropriate: allergies, current medications, past family history, past medical history, past social history, past surgical history and problem list.   Objective:   Vitals:   12/06/19 0955  BP: 113/77  Pulse: 92  Weight: 169 lb (76.7 kg)    Fetal Status: Fetal Heart Rate (bpm): 152 Fundal Height: 33 cm Movement: Present     General:  Alert, oriented and cooperative. Patient is in no acute distress.  Skin: Skin is warm and dry. No rash noted.   Cardiovascular: Normal heart rate noted  Respiratory: Normal respiratory effort, no problems with respiration noted  Abdomen: Soft, gravid, appropriate for gestational age.  Pain/Pressure: Present     Pelvic: Cervical exam performed in the presence of a chaperone Dilation: Closed Effacement (%): 0 Station: Ballotable  Extremities: Normal range of motion.  Edema: None  Mental Status: Normal mood and affect. Normal behavior. Normal judgment and thought content.   Assessment and Plan:  Pregnancy: G3P1011 at [redacted]w[redacted]d 1. Supervision of other normal pregnancy, antepartum --Anticipatory guidance about next visits/weeks of pregnancy given. --Next visit in 3 weeks for GBS  2. Chlamydia infection affecting  pregnancy in first trimester --Tx with negative TOC. Pt denies any intercourse since her treatment.  3. Pain of round ligament affecting pregnancy, antepartum --Pain is bilateral, shooting pain with movement.   --Rest/ice/heat/warm bath/Tylenol/pregnancy support belt  - Elastic Bandages & Supports (COMFORT FIT MATERNITY SUPP LG) MISC; 1 each by Does not apply route daily as needed.  Dispense: 1 each; Refill: 0 - cyclobenzaprine (FLEXERIL) 5 MG tablet; Take 1-2 tablets (5-10 mg total) by mouth 3 (three) times daily as needed for muscle spasms.  Dispense: 10 tablet; Refill: 0  4. Abdominal pain during pregnancy in third trimester -No evidence of PTL with closed cervix  Preterm labor symptoms and general obstetric precautions including but not limited to vaginal bleeding, contractions, leaking of fluid and fetal movement were reviewed in detail with the patient. Please refer to After Visit Summary for other counseling recommendations.   Return in about 3 weeks (around 12/27/2019).  Future Appointments  Date Time Provider Department Center  12/27/2019  1:00 PM [place], Wilmer Floor, CNM [facility]    Sharen Counter, CNM

## 2019-12-16 ENCOUNTER — Other Ambulatory Visit: Payer: Self-pay

## 2019-12-16 DIAGNOSIS — R102 Pelvic and perineal pain: Secondary | ICD-10-CM

## 2019-12-16 DIAGNOSIS — O26899 Other specified pregnancy related conditions, unspecified trimester: Secondary | ICD-10-CM

## 2019-12-16 MED ORDER — CYCLOBENZAPRINE HCL 5 MG PO TABS: 5.0000 mg | ORAL_TABLET | Freq: Three times a day (TID) | ORAL | 0 refills | Status: DC | PRN

## 2019-12-20 ENCOUNTER — Telehealth: Payer: Self-pay | Admitting: *Deleted

## 2019-12-20 NOTE — Telephone Encounter (Signed)
Patient was told to follow Babyscripts app for medications that she is able to take while she is pregnant. Follow up with PCP or Urgent Care for testing if she does not feel better after medication(s).

## 2019-12-23 DIAGNOSIS — J101 Influenza due to other identified influenza virus with other respiratory manifestations: Secondary | ICD-10-CM | POA: Diagnosis not present

## 2019-12-23 DIAGNOSIS — J069 Acute upper respiratory infection, unspecified: Secondary | ICD-10-CM | POA: Diagnosis not present

## 2019-12-23 DIAGNOSIS — Z03818 Encounter for observation for suspected exposure to other biological agents ruled out: Secondary | ICD-10-CM | POA: Diagnosis not present

## 2019-12-23 DIAGNOSIS — Z20828 Contact with and (suspected) exposure to other viral communicable diseases: Secondary | ICD-10-CM | POA: Diagnosis not present

## 2019-12-26 ENCOUNTER — Other Ambulatory Visit: Payer: Self-pay

## 2019-12-26 ENCOUNTER — Inpatient Hospital Stay (HOSPITAL_COMMUNITY)
Admission: AD | Admit: 2019-12-26 | Discharge: 2019-12-26 | Disposition: A | Discharge status: Home / Self Care | Payer: Medicaid Other | Attending: Obstetrics and Gynecology | Admitting: Obstetrics and Gynecology

## 2019-12-26 ENCOUNTER — Encounter (HOSPITAL_COMMUNITY): Payer: Self-pay | Admitting: Obstetrics and Gynecology

## 2019-12-26 DIAGNOSIS — Z3A36 36 weeks gestation of pregnancy: Secondary | ICD-10-CM | POA: Insufficient documentation

## 2019-12-26 DIAGNOSIS — G43909 Migraine, unspecified, not intractable, without status migrainosus: Secondary | ICD-10-CM | POA: Diagnosis not present

## 2019-12-26 DIAGNOSIS — Z3A13 13 weeks gestation of pregnancy: Secondary | ICD-10-CM

## 2019-12-26 DIAGNOSIS — G43809 Other migraine, not intractable, without status migrainosus: Secondary | ICD-10-CM

## 2019-12-26 DIAGNOSIS — O99891 Other specified diseases and conditions complicating pregnancy: Secondary | ICD-10-CM

## 2019-12-26 DIAGNOSIS — Z79899 Other long term (current) drug therapy: Secondary | ICD-10-CM | POA: Insufficient documentation

## 2019-12-26 DIAGNOSIS — O99333 Smoking (tobacco) complicating pregnancy, third trimester: Secondary | ICD-10-CM | POA: Insufficient documentation

## 2019-12-26 DIAGNOSIS — F17210 Nicotine dependence, cigarettes, uncomplicated: Secondary | ICD-10-CM | POA: Insufficient documentation

## 2019-12-26 DIAGNOSIS — Z791 Long term (current) use of non-steroidal anti-inflammatories (NSAID): Secondary | ICD-10-CM | POA: Diagnosis not present

## 2019-12-26 DIAGNOSIS — O99013 Anemia complicating pregnancy, third trimester: Secondary | ICD-10-CM

## 2019-12-26 DIAGNOSIS — Z8759 Personal history of other complications of pregnancy, childbirth and the puerperium: Secondary | ICD-10-CM

## 2019-12-26 DIAGNOSIS — O468X1 Other antepartum hemorrhage, first trimester: Secondary | ICD-10-CM

## 2019-12-26 DIAGNOSIS — R519 Headache, unspecified: Secondary | ICD-10-CM | POA: Diagnosis present

## 2019-12-26 DIAGNOSIS — O99353 Diseases of the nervous system complicating pregnancy, third trimester: Secondary | ICD-10-CM | POA: Diagnosis not present

## 2019-12-26 LAB — URINALYSIS, ROUTINE W REFLEX MICROSCOPIC
Bilirubin Urine: NEGATIVE
Glucose, UA: NEGATIVE mg/dL
Ketones, ur: NEGATIVE mg/dL
Nitrite: NEGATIVE
Protein, ur: NEGATIVE mg/dL
Specific Gravity, Urine: 1.008 (ref 1.005–1.030)
pH: 6 (ref 5.0–8.0)

## 2019-12-26 MED ORDER — METOCLOPRAMIDE HCL 10 MG PO TABS: 10.0000 mg | ORAL_TABLET | Freq: Three times a day (TID) | ORAL | 0 refills | Status: DC | PRN

## 2019-12-26 MED ORDER — ACETAMINOPHEN 500 MG PO TABS
1000.0000 mg | ORAL_TABLET | Freq: Once | ORAL | Status: AC
  Administered 2019-12-26: 1000 mg via ORAL
  Filled 2019-12-26: qty 2

## 2019-12-26 MED ORDER — LACTATED RINGERS IV BOLUS
1000.0000 mL | Freq: Once | INTRAVENOUS | Status: AC
  Administered 2019-12-26: 1000 mL via INTRAVENOUS

## 2019-12-26 MED ORDER — METOCLOPRAMIDE HCL 5 MG/ML IJ SOLN
10.0000 mg | Freq: Once | INTRAMUSCULAR | Status: AC
  Administered 2019-12-26: 10 mg via INTRAVENOUS
  Filled 2019-12-26: qty 2

## 2019-12-26 NOTE — MAU Provider Note (Signed)
History     [id number]  Arrival date and time: 12/26/19 1517    Chief Complaint  Patient presents with   Headache     HPI Robyn Stone is a 24 y.o. at [redacted]w[redacted]d by 13 wk [place] with PMHx notable for hx of PreE in prior pregnancy, who presents for headache.   Reports she has been having a migraine for the past week Told to take tylenol but feels like the pain is worse after taking it Takes usually one of tylenol, sometimes two, but does not know the dose of the pills at this time Reports headache has come and gone for the past week Sometimes will start in the morning, she will take a tylenol and will go away, but then will get the headache worse in the afternoon and won't go away until she goes to sleep Pain is always located in the front left side of her head No vision changes or seeing lights No chest pain or SOB No RUQ pain No swelling in legs No problems with balance or walking No numbness or tingling in body No weakness in legs or arms Reports a hx of migraines with her first pregnancy  Denies vaginal bleeding, loss of fluid, contractions Endorses good fetal movement   A/RH(D) POSITIVE/-- (02/10 1014)  OB History    Gravida  3   Para  1   Term  1   Preterm      AB  1   Living  1     SAB  1   TAB      Ectopic      Multiple  0   Live Births  1           Past Medical History:  Diagnosis Date   Anxiety    Headache    Medical history non-contributory    Pregnancy induced hypertension     Past Surgical History:  Procedure Laterality Date   NO PAST SURGERIES      Family History  Problem Relation Age of Onset   Hypertension Mother     Social History   Socioeconomic History   Marital status: Single    Spouse name: Not on file   Number of children: Not on file   Years of education: Not on file   Highest education level: Not on file  Occupational History   Not on file  Tobacco Use   Smoking status: Current Some Day  Smoker    Types: Cigarettes   Smokeless tobacco: Never Used   Tobacco comment: 1/2 day  Vaping Use   Vaping Use: Never used  Substance and Sexual Activity   Alcohol use: Yes    Alcohol/week: 0.0 standard drinks    Comment: occ   Drug use: Yes    Types: Marijuana    Comment: "few times a day"   Sexual activity: Yes    Birth control/protection: None  Other Topics Concern   Not on file  Social History Narrative   Not on file   Social Determinants of Health   Financial Resource Strain:    Difficulty of Paying Living Expenses:   Food Insecurity:    Worried About Programme researcher, broadcasting/film/video in the Last Year:    Barista in the Last Year:   Transportation Needs:    Freight forwarder (Medical):    Lack of Transportation (Non-Medical):   Physical Activity:    Days of Exercise per Week:    Minutes of Exercise  per Session:   Stress:    Feeling of Stress :   Social Connections:    Frequency of Communication with Friends and Family:    Frequency of Social Gatherings with Friends and Family:    Attends Religious Services:    Active Member of Clubs or Organizations:    Attends Engineer, structural:    Marital Status:   Intimate Partner Violence:    Fear of Current or Ex-Partner:    Emotionally Abused:    Physically Abused:    Sexually Abused:     No Known Allergies  No current facility-administered medications on file prior to encounter.   Current Outpatient Medications on File Prior to Encounter  Medication Sig Dispense Refill   aspirin EC 81 MG tablet Take 1 tablet (81 mg total) by mouth daily. (Patient not taking: Reported on 12/06/2019) 90 tablet 1   cyclobenzaprine (FLEXERIL) 5 MG tablet Take 1-2 tablets (5-10 mg total) by mouth 3 (three) times daily as needed for muscle spasms. 10 tablet 0   docusate sodium (COLACE) 100 MG capsule Take 1 capsule (100 mg total) by mouth 2 (two) times daily as needed. (Patient not taking: Reported  on 11/23/2019) 30 capsule 2   Elastic Bandages & Supports (COMFORT FIT MATERNITY SUPP LG) MISC 1 each by Does not apply route daily as needed. 1 each 0   pantoprazole (PROTONIX) 20 MG tablet Take 1 tablet (20 mg total) by mouth daily. (Patient not taking: Reported on 11/23/2019) 30 tablet 4   Prenatal Vit-Fe Fumarate-FA (PRENATAL COMPLETE) 14-0.4 MG TABS Take 1 tablet by mouth daily. 60 each 0   terconazole (TERAZOL 7) 0.4 % vaginal cream Place 1 applicator vaginally at bedtime. (Patient not taking: Reported on 11/23/2019) 45 g 0     ROS Pertinent positives and negative per HPI, all others reviewed and negative  Physical Exam   LMP 04/16/2019   Physical Exam Vitals reviewed.  Constitutional:      General: She is not in acute distress.    Appearance: She is well-developed. She is not diaphoretic.  Eyes:     General: No scleral icterus. Pulmonary:     Effort: Pulmonary effort is normal. No respiratory distress.  Abdominal:     General: There is no distension.     Palpations: Abdomen is soft.     Tenderness: There is no abdominal tenderness. There is no guarding or rebound.  Skin:    General: Skin is warm and dry.  Neurological:     Mental Status: She is alert.     Cranial Nerves: No cranial nerve deficit.     Sensory: No sensory deficit.     Motor: No weakness.     Coordination: Coordination normal.  Psychiatric:        Mood and Affect: Mood normal.     Cervical Exam   Deferred  Bedside Ultrasound Not done  My interpretation: n/a  FHT Baseline 135, moderate variability, +accels, no decels Toco: no contractions Cat: Cat I  Labs No results found for this or any previous visit (from the past 24 hour(s)).  Imaging No results found.  MAU Course  Procedures Lab Orders  No laboratory test(s) ordered today   No orders of the defined types were placed in this encounter.  Imaging Orders  No imaging studies ordered today    MDM moderate  Assessment and  Plan  #Migraine Presentation and hx most c/w migraine, neuro exam normal, BP normal so low suspicion for PreE. Treated  with 1L IVF, reglan, and tylenol, with resolution of headache. Discharged to home w return precautions, has OB appt tomorrow.   #FWB FHT Cat I NST: Reactive  Venora Maples

## 2019-12-26 NOTE — Discharge Instructions (Signed)

## 2019-12-26 NOTE — MAU Note (Signed)
No urine culture due to small quantity provided

## 2019-12-26 NOTE — MAU Note (Signed)
Been having like a huge migraine for the past wk, called office, told to take Tylenol, wakes up and HA is worse.   Denies visual changes, epigastric pain, or increase in swelling. Denies vag bleeding or LOF. Is cramping, was told it is ligment pain.

## 2019-12-27 ENCOUNTER — Other Ambulatory Visit (HOSPITAL_COMMUNITY)
Admission: RE | Admit: 2019-12-27 | Discharge: 2019-12-27 | Disposition: A | Discharge status: Home / Self Care | Payer: Medicaid Other | Source: Ambulatory Visit | Attending: Advanced Practice Midwife | Admitting: Advanced Practice Midwife

## 2019-12-27 ENCOUNTER — Ambulatory Visit (INDEPENDENT_AMBULATORY_CARE_PROVIDER_SITE_OTHER): Payer: Medicaid Other | Admitting: Advanced Practice Midwife

## 2019-12-27 VITALS — BP 119/72 | HR 85 | Wt 178.0 lb

## 2019-12-27 DIAGNOSIS — Z8759 Personal history of other complications of pregnancy, childbirth and the puerperium: Secondary | ICD-10-CM

## 2019-12-27 DIAGNOSIS — O98813 Other maternal infectious and parasitic diseases complicating pregnancy, third trimester: Secondary | ICD-10-CM

## 2019-12-27 DIAGNOSIS — O98313 Other infections with a predominantly sexual mode of transmission complicating pregnancy, third trimester: Secondary | ICD-10-CM

## 2019-12-27 DIAGNOSIS — A749 Chlamydial infection, unspecified: Secondary | ICD-10-CM

## 2019-12-27 DIAGNOSIS — Z3480 Encounter for supervision of other normal pregnancy, unspecified trimester: Secondary | ICD-10-CM | POA: Insufficient documentation

## 2019-12-27 DIAGNOSIS — O26843 Uterine size-date discrepancy, third trimester: Secondary | ICD-10-CM

## 2019-12-27 DIAGNOSIS — O99820 Streptococcus B carrier state complicating pregnancy: Secondary | ICD-10-CM

## 2019-12-27 DIAGNOSIS — Z3A36 36 weeks gestation of pregnancy: Secondary | ICD-10-CM

## 2019-12-27 NOTE — Patient Instructions (Addendum)
  Labor Precautions Reasons to come to MAU at [facility]:  1.  Contractions are  5 minutes apart or less, each last 1 minute, these have been going on for 1-2 hours, and you cannot walk or talk during them 2.  You have a large gush of fluid, or a trickle of fluid that will not stop and you have to wear a pad 3.  You have bleeding that is bright red, heavier than spotting--like menstrual bleeding (spotting can be normal in early labor or after a check of your cervix) 4.  You do not feel the baby moving like he/she normally d  Things to Try After 37 weeks to Encourage Labor/Get Ready for Labor:   1.  Try the [employer] at [email or web address].[email or web address] daily to improve baby's position and encourage the onset of labor.  2. Walk a little and rest a little every day.  Change positions often.  3. Cervical Ripening: May try one or both a. Red Raspberry Leaf capsules or tea:  two 300mg  or 400mg  tablets with each meal, 2-3 times a day, or 1-3 cups of tea daily  Potential Side Effects Of Raspberry Leaf:  Most women do not experience any side effects from drinking raspberry leaf tea. However, nausea and loose stools are possible   b. Evening Primrose Oil capsules: may take 1 to 3 capsules daily. Take 1-2 capsules by mouth each day and place one capsule vaginally at night.  You may also prick the vaginal capsule to release the oil prior to inserting in the vagina. Some of the potential side effects:  Upset stomach  Loose stools or diarrhea  Headaches  Nausea  4. Sex (and especially sex with orgasm) can also help the cervix ripen and encourage labor onset.

## 2019-12-27 NOTE — Progress Notes (Signed)
   PRENATAL VISIT NOTE  Subjective:  Robyn Stone is a 24 y.o. G3P1011 at [redacted]w[redacted]d being seen today for ongoing prenatal care.  She is currently monitored for the following issues for this low-risk pregnancy and has Cannabis abuse; Supervision of other normal pregnancy, antepartum; Chlamydia infection affecting pregnancy in first trimester; Hx of preeclampsia, prior pregnancy, currently pregnant, first trimester; Subchorionic hematoma in first trimester; and Anemia in pregnancy on their problem list.  Patient reports occasional contractions.  Contractions: Not present. Vag. Bleeding: None.  Movement: Present. Denies leaking of fluid.   The following portions of the patient's history were reviewed and updated as appropriate: allergies, current medications, past family history, past medical history, past social history, past surgical history and problem list.   Objective:   Vitals:   12/27/19 1315  BP: 119/72  Pulse: 85  Weight: 178 lb (80.7 kg)    Fetal Status: Fetal Heart Rate (bpm): 148 Fundal Height: 33 cm Movement: Present  Presentation: Vertex  General:  Alert, oriented and cooperative. Patient is in no acute distress.  Skin: Skin is warm and dry. No rash noted.   Cardiovascular: Normal heart rate noted  Respiratory: Normal respiratory effort, no problems with respiration noted  Abdomen: Soft, gravid, appropriate for gestational age.  Pain/Pressure: Present     Pelvic: Cervical exam performed in the presence of a chaperone Dilation: Fingertip Effacement (%): 50 Station: -3  Extremities: Normal range of motion.  Edema: None  Mental Status: Normal mood and affect. Normal behavior. Normal judgment and thought content.   Assessment and Plan:  Pregnancy: G3P1011 at [redacted]w[redacted]d 1. Supervision of other normal pregnancy, antepartum --Anticipatory guidance about next visits/weeks of pregnancy given. --Reviewed labor readiness including the [employer] and evening primrose oil with pt and  printed materials given  - Cervicovaginal ancillary only( [place]) - Culture, beta strep (group b only)  2. Uterine size date discrepancy pregnancy, third trimester --Fetal position is oblique, head down which is likely cause of lower fundal height, as pt measured normally at last visit, but will get EFW with follow up US  - [place] MFM OB FOLLOW UP; Future  Term labor symptoms and general obstetric precautions including but not limited to vaginal bleeding, contractions, leaking of fluid and fetal movement were reviewed in detail with the patient. Please refer to After Visit Summary for other counseling recommendations.   Return in about 2 weeks (around 01/10/2020).  Future Appointments  Date Time Provider Department Center  01/10/2020 10:35 AM Rolm Bookbinder, CNM [facility]    Sharen Counter, CNM

## 2019-12-28 LAB — CERVICOVAGINAL ANCILLARY ONLY
Chlamydia: POSITIVE — AB
Comment: NEGATIVE
Comment: NORMAL
Neisseria Gonorrhea: NEGATIVE

## 2019-12-29 DIAGNOSIS — O99820 Streptococcus B carrier state complicating pregnancy: Secondary | ICD-10-CM | POA: Insufficient documentation

## 2019-12-29 DIAGNOSIS — O98813 Other maternal infectious and parasitic diseases complicating pregnancy, third trimester: Secondary | ICD-10-CM | POA: Insufficient documentation

## 2019-12-29 LAB — CULTURE, BETA STREP (GROUP B ONLY)
MICRO NUMBER:: 10757118
SPECIMEN QUALITY:: ADEQUATE

## 2019-12-29 MED ORDER — AZITHROMYCIN 500 MG PO TABS: 1000.0000 mg | ORAL_TABLET | Freq: Once | ORAL | 1 refills | Status: AC

## 2019-12-29 NOTE — Addendum Note (Signed)
Addended by: Sharen Counter A on: 12/29/2019 10:43 PM   Modules accepted: Orders

## 2019-12-30 ENCOUNTER — Other Ambulatory Visit: Payer: Self-pay

## 2019-12-30 ENCOUNTER — Ambulatory Visit: Payer: Medicaid Other | Attending: Advanced Practice Midwife

## 2019-12-30 ENCOUNTER — Telehealth: Payer: Self-pay

## 2019-12-30 DIAGNOSIS — Z3A36 36 weeks gestation of pregnancy: Secondary | ICD-10-CM

## 2019-12-30 DIAGNOSIS — O09292 Supervision of pregnancy with other poor reproductive or obstetric history, second trimester: Secondary | ICD-10-CM | POA: Diagnosis not present

## 2019-12-30 DIAGNOSIS — Z8759 Personal history of other complications of pregnancy, childbirth and the puerperium: Secondary | ICD-10-CM | POA: Diagnosis not present

## 2019-12-30 DIAGNOSIS — O26843 Uterine size-date discrepancy, third trimester: Secondary | ICD-10-CM | POA: Insufficient documentation

## 2019-12-30 DIAGNOSIS — Z362 Encounter for other antenatal screening follow-up: Secondary | ICD-10-CM

## 2019-12-30 NOTE — Telephone Encounter (Signed)
Pt notified of results Voiced understanding  Pt asked which pharmacy Rx was sent to  Pharmacy confirmed Pt will get Rx today Forms faxed to [facility].

## 2019-12-30 NOTE — Telephone Encounter (Signed)
-----   Message from Hurshel Party, CNM sent at 12/29/2019 10:43 PM EDT ----- Pt is positive for chlamydia and GBS positive.  She was treated for chlamydia in first trimester and then negative on TOC so this is likely a reinfection.  I am sending azithromycin Rx with a refill for her partner.  I will send a MyChart message but please call the patient to make sure she gets the message and starts the medication since she is close to delivery.  She will get antibiotics in labor for the GBS.  Thank you.

## 2020-01-01 DIAGNOSIS — Z419 Encounter for procedure for purposes other than remedying health state, unspecified: Secondary | ICD-10-CM | POA: Diagnosis not present

## 2020-01-03 ENCOUNTER — Other Ambulatory Visit: Payer: Self-pay

## 2020-01-03 ENCOUNTER — Encounter (HOSPITAL_COMMUNITY): Payer: Self-pay | Admitting: Family Medicine

## 2020-01-03 ENCOUNTER — Inpatient Hospital Stay (HOSPITAL_COMMUNITY)
Admission: AD | Admit: 2020-01-03 | Discharge: 2020-01-03 | Disposition: A | Discharge status: Home / Self Care | Payer: Medicaid Other | Attending: Family Medicine | Admitting: Family Medicine

## 2020-01-03 DIAGNOSIS — O99213 Obesity complicating pregnancy, third trimester: Secondary | ICD-10-CM | POA: Diagnosis not present

## 2020-01-03 DIAGNOSIS — O98313 Other infections with a predominantly sexual mode of transmission complicating pregnancy, third trimester: Secondary | ICD-10-CM

## 2020-01-03 DIAGNOSIS — A749 Chlamydial infection, unspecified: Secondary | ICD-10-CM

## 2020-01-03 DIAGNOSIS — Z3A37 37 weeks gestation of pregnancy: Secondary | ICD-10-CM

## 2020-01-03 DIAGNOSIS — R519 Headache, unspecified: Secondary | ICD-10-CM

## 2020-01-03 DIAGNOSIS — D649 Anemia, unspecified: Secondary | ICD-10-CM | POA: Diagnosis not present

## 2020-01-03 DIAGNOSIS — J302 Other seasonal allergic rhinitis: Secondary | ICD-10-CM | POA: Diagnosis not present

## 2020-01-03 DIAGNOSIS — O98813 Other maternal infectious and parasitic diseases complicating pregnancy, third trimester: Secondary | ICD-10-CM

## 2020-01-03 DIAGNOSIS — O418X10 Other specified disorders of amniotic fluid and membranes, first trimester, not applicable or unspecified: Secondary | ICD-10-CM

## 2020-01-03 DIAGNOSIS — Z3689 Encounter for other specified antenatal screening: Secondary | ICD-10-CM

## 2020-01-03 DIAGNOSIS — O99323 Drug use complicating pregnancy, third trimester: Secondary | ICD-10-CM

## 2020-01-03 DIAGNOSIS — O99013 Anemia complicating pregnancy, third trimester: Secondary | ICD-10-CM | POA: Diagnosis not present

## 2020-01-03 DIAGNOSIS — R102 Pelvic and perineal pain unspecified side: Secondary | ICD-10-CM

## 2020-01-03 DIAGNOSIS — Z87891 Personal history of nicotine dependence: Secondary | ICD-10-CM | POA: Insufficient documentation

## 2020-01-03 DIAGNOSIS — Z79899 Other long term (current) drug therapy: Secondary | ICD-10-CM | POA: Insufficient documentation

## 2020-01-03 DIAGNOSIS — R030 Elevated blood-pressure reading, without diagnosis of hypertension: Secondary | ICD-10-CM

## 2020-01-03 DIAGNOSIS — Z331 Pregnant state, incidental: Secondary | ICD-10-CM | POA: Diagnosis not present

## 2020-01-03 DIAGNOSIS — Z7982 Long term (current) use of aspirin: Secondary | ICD-10-CM | POA: Diagnosis not present

## 2020-01-03 DIAGNOSIS — F1290 Cannabis use, unspecified, uncomplicated: Secondary | ICD-10-CM

## 2020-01-03 DIAGNOSIS — O26893 Other specified pregnancy related conditions, third trimester: Secondary | ICD-10-CM | POA: Diagnosis not present

## 2020-01-03 DIAGNOSIS — O99820 Streptococcus B carrier state complicating pregnancy: Secondary | ICD-10-CM | POA: Diagnosis not present

## 2020-01-03 DIAGNOSIS — E669 Obesity, unspecified: Secondary | ICD-10-CM

## 2020-01-03 DIAGNOSIS — O468X1 Other antepartum hemorrhage, first trimester: Secondary | ICD-10-CM

## 2020-01-03 LAB — COMPREHENSIVE METABOLIC PANEL
ALT: 11 U/L (ref 0–44)
AST: 11 U/L — ABNORMAL LOW (ref 15–41)
Albumin: 2.5 g/dL — ABNORMAL LOW (ref 3.5–5.0)
Alkaline Phosphatase: 195 U/L — ABNORMAL HIGH (ref 38–126)
Anion gap: 9 (ref 5–15)
BUN: <5 mg/dL — ABNORMAL LOW (ref 6–20)
CO2: 20 mmol/L — ABNORMAL LOW (ref 22–32)
Calcium: 8.7 mg/dL — ABNORMAL LOW (ref 8.9–10.3)
Chloride: 107 mmol/L (ref 98–111)
Creatinine, Ser: 0.39 mg/dL — ABNORMAL LOW (ref 0.44–1.00)
GFR calc Af Amer: >60 mL/min (ref >60)
GFR calc non Af Amer: >60 mL/min (ref >60)
Glucose, Bld: 96 mg/dL (ref 70–99)
Potassium: 3.7 mmol/L (ref 3.5–5.1)
Sodium: 136 mmol/L (ref 135–145)
Total Bilirubin: 0.4 mg/dL (ref 0.3–1.2)
Total Protein: 6.3 g/dL — ABNORMAL LOW (ref 6.5–8.1)

## 2020-01-03 LAB — URINALYSIS, ROUTINE W REFLEX MICROSCOPIC
Bilirubin Urine: NEGATIVE
Glucose, UA: NEGATIVE mg/dL
Hgb urine dipstick: NEGATIVE
Ketones, ur: NEGATIVE mg/dL
Nitrite: NEGATIVE
Protein, ur: NEGATIVE mg/dL
Specific Gravity, Urine: 1.023 (ref 1.005–1.030)
WBC, UA: >50 WBC/hpf — ABNORMAL HIGH (ref 0–5)
pH: 5 (ref 5.0–8.0)

## 2020-01-03 LAB — CBC
HCT: 28.9 % — ABNORMAL LOW (ref 36.0–46.0)
Hemoglobin: 9.2 g/dL — ABNORMAL LOW (ref 12.0–15.0)
MCH: 25.6 pg — ABNORMAL LOW (ref 26.0–34.0)
MCHC: 31.8 g/dL (ref 30.0–36.0)
MCV: 80.3 fL (ref 80.0–100.0)
Platelets: 334 10*3/uL (ref 150–400)
RBC: 3.6 MIL/uL — ABNORMAL LOW (ref 3.87–5.11)
RDW: 14.7 % (ref 11.5–15.5)
WBC: 8.1 10*3/uL (ref 4.0–10.5)
nRBC: 0 % (ref 0.0–0.2)

## 2020-01-03 LAB — PROTEIN / CREATININE RATIO, URINE
Creatinine, Urine: 197.99 mg/dL
Protein Creatinine Ratio: 0.12 mg/mg{Cre} (ref 0.00–0.15)
Total Protein, Urine: 23 mg/dL

## 2020-01-03 MED ORDER — PANTOPRAZOLE SODIUM 20 MG PO TBEC: 20.0000 mg | DELAYED_RELEASE_TABLET | Freq: Every day | ORAL | 2 refills | Status: DC

## 2020-01-03 MED ORDER — CYCLOBENZAPRINE HCL 5 MG PO TABS
10.0000 mg | ORAL_TABLET | Freq: Once | ORAL | Status: AC
  Administered 2020-01-03: 10 mg via ORAL
  Filled 2020-01-03: qty 2

## 2020-01-03 MED ORDER — FERROUS SULFATE 325 (65 FE) MG PO TBEC: 325.0000 mg | DELAYED_RELEASE_TABLET | Freq: Two times a day (BID) | ORAL | 3 refills | Status: DC

## 2020-01-03 NOTE — MAU Provider Note (Signed)
History     CSN: [id number]  Arrival date and time: 01/03/20 1424   First Provider Initiated Contact with Patient 01/03/20 1522      Chief Complaint  Patient presents with  . Pelvic Pain  . Nausea   Robyn Stone is a 23 y.o. G3P1011 at [redacted]w[redacted]d who receives care at [facility].  She presents today for Pelvic Pain and Nausea.  Patient states that for the past 2-3 days she has been having sharp pain "on my uterus."  Patient reports that the pain is located in her vagina and lower abdominal area.  Patient states the pain is not round ligament and she was taking flexeril with some relief for about 20 minutes.  Patient states the pain lasts 5-10 minutes at each interval and is intermittent in nature.  Patient is unsure if the pain is contractions or not. However, patient reports symptoms are improved with wearing her maternity belt. Patient endorses fetal movement and denies vaginal discharge.  Patient denies recent sexual activity. Patient states she has not taken any medications today. Patient reports completing medications for CT infection "a few days ago." Patient goes on to state that she was treated in early pregnancy and "it came back."    OB History    Gravida  3   Para  1   Term  1   Preterm      AB  1   Living  1     SAB  1   TAB      Ectopic      Multiple  0   Live Births  1           Past Medical History:  Diagnosis Date  . Anxiety   . Headache   . Medical history non-contributory   . Pregnancy induced hypertension     Past Surgical History:  Procedure Laterality Date  . NO PAST SURGERIES      Family History  Problem Relation Age of Onset  . Hypertension Mother     Social History   Tobacco Use  . Smoking status: Former Smoker    Types: Cigarettes    Quit date: 08/01/2019    Years since quitting: 0.4  . Smokeless tobacco: Never Used  . Tobacco comment: 1/2 day  Vaping Use  . Vaping Use: Never used  Substance Use Topics  . Alcohol use:  Not Currently    Alcohol/week: 0.0 standard drinks    Comment: occ  . Drug use: Not Currently    Types: Marijuana    Comment: last time 3 weeks ago    Allergies: No Known Allergies  Medications Prior to Admission  Medication Sig Dispense Refill Last Dose  . cyclobenzaprine (FLEXERIL) 5 MG tablet Take 1-2 tablets (5-10 mg total) by mouth 3 (three) times daily as needed for muscle spasms. 10 tablet 0 Past Week at Unknown time  . aspirin EC 81 MG tablet Take 1 tablet (81 mg total) by mouth daily. (Patient not taking: Reported on 12/06/2019) 90 tablet 1   . docusate sodium (COLACE) 100 MG capsule Take 1 capsule (100 mg total) by mouth 2 (two) times daily as needed. (Patient not taking: Reported on 11/23/2019) 30 capsule 2   . Elastic Bandages & Supports (COMFORT FIT MATERNITY SUPP LG) MISC 1 each by Does not apply route daily as needed. (Patient not taking: Reported on 12/27/2019) 1 each 0   . metoCLOPramide (REGLAN) 10 MG tablet Take 1 tablet (10 mg total) by mouth every  8 (eight) hours as needed for nausea (headache). (Patient not taking: Reported on 12/27/2019) 30 tablet 0   . pantoprazole (PROTONIX) 20 MG tablet Take 1 tablet (20 mg total) by mouth daily. (Patient not taking: Reported on 11/23/2019) 30 tablet 4   . Prenatal Vit-Fe Fumarate-FA (PRENATAL COMPLETE) 14-0.4 MG TABS Take 1 tablet by mouth daily. (Patient not taking: Reported on 12/27/2019) 60 each 0   . terconazole (TERAZOL 7) 0.4 % vaginal cream Place 1 applicator vaginally at bedtime. (Patient not taking: Reported on 11/23/2019) 45 g 0     Review of Systems  Constitutional: Negative for chills and fever.  Respiratory: Negative for cough and shortness of breath.   Gastrointestinal: Positive for nausea (None currently, but experienced this morning. ). Negative for abdominal pain and vomiting.  Genitourinary: Positive for pelvic pain. Negative for difficulty urinating, dysuria, vaginal bleeding and vaginal discharge.  Neurological:  Positive for headaches (Frontal 4-5/10). Negative for dizziness and light-headedness.   Physical Exam   Blood pressure 125/86, pulse 92, temperature 98.4 F (36.9 C), temperature source Oral, resp. rate 18, height 5\' 2"  (1.575 m), weight 82.3 kg, last menstrual period 04/16/2019, SpO2 100 %, unknown if currently breastfeeding.   Physical Exam Constitutional:      Appearance: Normal appearance. She is obese.  HENT:     Head: Normocephalic and atraumatic.  Eyes:     Conjunctiva/sclera: Conjunctivae normal.  Cardiovascular:     Rate and Rhythm: Normal rate and regular rhythm.     Heart sounds: Normal heart sounds.  Pulmonary:     Effort: Pulmonary effort is normal. No respiratory distress.     Breath sounds: Normal breath sounds.  Abdominal:     Tenderness: There is no abdominal tenderness.     Comments: Gravid  Musculoskeletal:        General: Normal range of motion.     Cervical back: Normal range of motion.     Right lower leg: No edema.     Left lower leg: No edema.  Skin:    General: Skin is warm and dry.  Neurological:     Mental Status: She is alert and oriented to person, place, and time.  Psychiatric:        Mood and Affect: Mood normal.        Behavior: Behavior normal.        Thought Content: Thought content normal.     Fetal Assessment 145 bpm, Mod Var, -Decels, +Accels Toco: None graphed  MAU Course   Results for orders placed or performed during the hospital encounter of 01/03/20 (from the past 24 hour(s))  Urinalysis, Routine w reflex microscopic     Status: Abnormal   Collection Time: 01/03/20  2:48 PM  Result Value Ref Range   Color, Urine YELLOW YELLOW   APPearance HAZY (A) CLEAR   Specific Gravity, Urine 1.023 1.005 - 1.030   pH 5.0 5.0 - 8.0   Glucose, UA NEGATIVE NEGATIVE mg/dL   Hgb urine dipstick NEGATIVE NEGATIVE   Bilirubin Urine NEGATIVE NEGATIVE   Ketones, ur NEGATIVE NEGATIVE mg/dL   Protein, ur NEGATIVE NEGATIVE mg/dL   Nitrite  NEGATIVE NEGATIVE   Leukocytes,Ua LARGE (A) NEGATIVE   RBC / HPF 0-5 0 - 5 RBC/hpf   WBC, UA >50 (H) 0 - 5 WBC/hpf   Bacteria, UA RARE (A) NONE SEEN   Squamous Epithelial / LPF 11-20 0 - 5   Mucus PRESENT    Hyaline Casts, UA PRESENT   CBC  Status: Abnormal   Collection Time: 01/03/20  3:42 PM  Result Value Ref Range   WBC 8.1 4.0 - 10.5 K/uL   RBC 3.60 (L) 3.87 - 5.11 MIL/uL   Hemoglobin 9.2 (L) 12.0 - 15.0 g/dL   HCT 62.8 (L) 36 - 46 %   MCV 80.3 80.0 - 100.0 fL   MCH 25.6 (L) 26.0 - 34.0 pg   MCHC 31.8 30.0 - 36.0 g/dL   RDW 36.6 29.4 - 76.5 %   Platelets 334 150 - 400 K/uL   nRBC 0.0 0.0 - 0.2 %   No results found.  MDM PE Labs: EFM  Assessment and Plan  24 year old G3P1011  SIUP at 37.3weeks Cat I FT Pelvic Pain Vaginal Pain Headache Elevated BP  -Brief discussion regarding CT and how it is a STD that is transmitted sexually.  Patient verbalized understanding. -POC Reviewed.  -Informed that formal pelvic exam and cultures would be deferred d/t recent STD treatment. -Exam performed. -Discussed how provider unable to get FT through cervical os and would consider cervix closed and long. -Will try flexeril for HA and pelvic pain since patient has had good response in past. -BP elevated x1 (125/93) upon arrival. Will get PreE labs to r/o. -Patient without questions or concerns.  Will await lab results.   Cherre Robins MSN, CNM 01/03/2020, 3:22 PM   Reassessment (5:30 PM) BP Normotensive Anemia  -Provider to bedside and patient reports improvement in HA. -Informed that BP have been normal range, but will labs still pending. -Patient continues to report intermittent pelvic pain. -Encouraged to wear maternity belt and rest as needed. -Discussed anemia and iron level of 9.2 with recommendation for Iron infusion today. -Patient also given option of iron supplement and states she would rather do iron supplement. -Rx for twice daily use sent to pharmacy on file.   Discussed side effects of iron usage including black stools and constipation.  Encouraged increased  fiber and water intake.   Reassessment (5:46 PM) -Lab called regarding labs that have been pending for >1hr.  Reports machines down. -Patient to be discharged home and provider will follow up if labs abnormal.  -Patient agreeable with plan. -Encouraged to call or return to MAU if symptoms worsen or with the onset of new symptoms. -Discharged to home in stable condition.  Cherre Robins MSN, CNM Advanced Practice Provider, Center for [employer]

## 2020-01-03 NOTE — Discharge Instructions (Signed)

## 2020-01-03 NOTE — MAU Note (Signed)
Presents with c/o sharp intermittent pelvic pain, lower abdominal pain, and nausea.  Denies VB or LOF, reports vaginal discharge.  Reports +FM.

## 2020-01-04 LAB — URINE CULTURE

## 2020-01-06 ENCOUNTER — Other Ambulatory Visit: Payer: Self-pay

## 2020-01-06 ENCOUNTER — Encounter (HOSPITAL_COMMUNITY): Payer: Self-pay | Admitting: Obstetrics and Gynecology

## 2020-01-06 ENCOUNTER — Inpatient Hospital Stay (HOSPITAL_COMMUNITY)
Admission: AD | Admit: 2020-01-06 | Discharge: 2020-01-06 | Disposition: A | Discharge status: Home / Self Care | Payer: Medicaid Other | Attending: Obstetrics and Gynecology | Admitting: Obstetrics and Gynecology

## 2020-01-06 DIAGNOSIS — Z3A37 37 weeks gestation of pregnancy: Secondary | ICD-10-CM

## 2020-01-06 DIAGNOSIS — O471 False labor at or after 37 completed weeks of gestation: Secondary | ICD-10-CM | POA: Diagnosis not present

## 2020-01-06 DIAGNOSIS — Z3A38 38 weeks gestation of pregnancy: Secondary | ICD-10-CM | POA: Insufficient documentation

## 2020-01-06 DIAGNOSIS — O479 False labor, unspecified: Secondary | ICD-10-CM

## 2020-01-06 DIAGNOSIS — O99013 Anemia complicating pregnancy, third trimester: Secondary | ICD-10-CM

## 2020-01-06 DIAGNOSIS — O468X1 Other antepartum hemorrhage, first trimester: Secondary | ICD-10-CM

## 2020-01-06 DIAGNOSIS — O99820 Streptococcus B carrier state complicating pregnancy: Secondary | ICD-10-CM

## 2020-01-06 DIAGNOSIS — O98813 Other maternal infectious and parasitic diseases complicating pregnancy, third trimester: Secondary | ICD-10-CM

## 2020-01-06 LAB — URINALYSIS, ROUTINE W REFLEX MICROSCOPIC
Bilirubin Urine: NEGATIVE
Glucose, UA: NEGATIVE mg/dL
Hgb urine dipstick: NEGATIVE
Ketones, ur: NEGATIVE mg/dL
Leukocytes,Ua: NEGATIVE
Nitrite: NEGATIVE
Protein, ur: NEGATIVE mg/dL
Specific Gravity, Urine: 1.01 (ref 1.005–1.030)
pH: 7 (ref 5.0–8.0)

## 2020-01-06 NOTE — MAU Provider Note (Signed)
Ms. Robyn Stone is a [id number] a2t [redacted]w[redacted]d seen in MAU for labor. RN labor check, not seen by provider. SVE by RN Dilation: 2.5 Effacement (%): 70 Station: -3 Presentation: Vertex Exam by:: J.Bellamy, RN   NST - FHR: 145 bpm / moderate variability / accels present / decels absent / TOCO: regular every 3 mins   Plan:  D/C home with labor precautions Keep scheduled appt with [facility] on Tuesday 01/10/2020  Raelyn Mora, CNM  01/06/2020 8:54 PM

## 2020-01-06 NOTE — Discharge Instructions (Signed)
Please purchase Tylenol and Benadryl over the counter and take.

## 2020-01-06 NOTE — MAU Note (Signed)
Pt reports to MAU c/o ctx every 2-3 min for the last hour. Pt reports the pain is a 8/10. No bleeding or LOF. +FM.

## 2020-01-07 ENCOUNTER — Inpatient Hospital Stay (HOSPITAL_COMMUNITY)
Admission: AD | Admit: 2020-01-07 | Discharge: 2020-01-07 | Disposition: A | Discharge status: Home / Self Care | Payer: Medicaid Other | Attending: Obstetrics and Gynecology | Admitting: Obstetrics and Gynecology

## 2020-01-07 ENCOUNTER — Other Ambulatory Visit: Payer: Self-pay

## 2020-01-07 ENCOUNTER — Encounter (HOSPITAL_COMMUNITY): Payer: Self-pay | Admitting: Obstetrics and Gynecology

## 2020-01-07 DIAGNOSIS — Z3A38 38 weeks gestation of pregnancy: Secondary | ICD-10-CM | POA: Insufficient documentation

## 2020-01-07 DIAGNOSIS — O26853 Spotting complicating pregnancy, third trimester: Secondary | ICD-10-CM

## 2020-01-07 NOTE — MAU Note (Signed)
Pt reports to MAU stating her ctx's have increased and gotten stronger and she noticed some bleeding as well. No LOF. +FM.

## 2020-01-07 NOTE — MAU Note (Signed)
I have communicated with Carloyn Jaeger CNM and reviewed vital signs:  Vitals:   01/07/20 0726  BP: 110/67  Pulse: 75  Resp: 18  Temp: 97.8 F (36.6 C)  SpO2: 99%    Vaginal exam:  Dilation: 3 Effacement (%): 70 Cervical Position: Posterior Station: -3 Presentation: Vertex Exam by:: Janeth Rase RN,   Also reviewed contraction pattern and that non-stress test is reactive.  It has been documented that patient is not contracting with no cervical change over 1.5 hours not indicating active labor.  Patient denies any other complaints.  Based on this report provider has given order for discharge.  A discharge order and diagnosis entered by a provider.   Labor discharge instructions reviewed with patient.

## 2020-01-07 NOTE — Discharge Instructions (Signed)
You will expect to see some vaginal spotting since you have been checked 4 times tonight.  Return to MAU:  If you have heavy bleeding that soaks through a pad in 1 hour or it runs down your legs  If you bleed so much that you feel like you might pass out or you do pass out  If you have significant abdominal pain that is not improved with Tylenol 1000 mg every 6 hours as needed for pain  If you develop a fever > 100.5

## 2020-01-09 ENCOUNTER — Encounter: Payer: Self-pay | Admitting: Advanced Practice Midwife

## 2020-01-09 ENCOUNTER — Encounter (HOSPITAL_COMMUNITY): Payer: Self-pay | Admitting: Obstetrics and Gynecology

## 2020-01-09 ENCOUNTER — Inpatient Hospital Stay (HOSPITAL_COMMUNITY)
Admission: AD | Admit: 2020-01-09 | Discharge: 2020-01-09 | Disposition: A | Discharge status: Home / Self Care | Payer: Medicaid Other | Attending: Obstetrics and Gynecology | Admitting: Obstetrics and Gynecology

## 2020-01-09 ENCOUNTER — Other Ambulatory Visit: Payer: Self-pay

## 2020-01-09 DIAGNOSIS — O99820 Streptococcus B carrier state complicating pregnancy: Secondary | ICD-10-CM

## 2020-01-09 DIAGNOSIS — Z3A38 38 weeks gestation of pregnancy: Secondary | ICD-10-CM | POA: Insufficient documentation

## 2020-01-09 DIAGNOSIS — A749 Chlamydial infection, unspecified: Secondary | ICD-10-CM

## 2020-01-09 DIAGNOSIS — O471 False labor at or after 37 completed weeks of gestation: Secondary | ICD-10-CM

## 2020-01-09 DIAGNOSIS — O98813 Other maternal infectious and parasitic diseases complicating pregnancy, third trimester: Secondary | ICD-10-CM

## 2020-01-09 DIAGNOSIS — O26843 Uterine size-date discrepancy, third trimester: Secondary | ICD-10-CM | POA: Insufficient documentation

## 2020-01-09 NOTE — MAU Provider Note (Addendum)
Robyn Stone is a 24 y.o. G63P1011 female at [redacted]w[redacted]d who presents to the MAU for contractions every 3 minutes.  RN Labor check, not seen by provider  SVE by RN: Dilation: 2 Effacement (%): 70 Station: -3 Exam by:: TLYTLE RN  NST: FHR baseline 130 bpm, Variability: moderate, Accelerations:present, Decelerations:  Absent= Cat 1/Reactive Toco: irritable  D/C home  Evelena Leyden DO,  PGY 1 Family Medicine  01/09/2020 12:46 PM   GME ATTESTATION:  I saw and evaluated the patient. I agree with the findings and the plan of care as documented in the resident's note.  Alric Seton, MD OB Fellow, Faculty [facility], Center for [facility] 01/09/2020 12:56 PM

## 2020-01-09 NOTE — Discharge Instructions (Signed)
Braxton Hicks Contractions °Contractions of the uterus can occur throughout pregnancy, but they are not always a sign that you are in labor. You may have practice contractions called Braxton Hicks contractions. These false labor contractions are sometimes confused with true labor. °What are Braxton Hicks contractions? °Braxton Hicks contractions are tightening movements that occur in the muscles of the uterus before labor. Unlike true labor contractions, these contractions do not result in opening (dilation) and thinning of the cervix. Toward the end of pregnancy (32-34 weeks), Braxton Hicks contractions can happen more often and may become stronger. These contractions are sometimes difficult to tell apart from true labor because they can be very uncomfortable. You should not feel embarrassed if you go to the hospital with false labor. °Sometimes, the only way to tell if you are in true labor is for your health care provider to look for changes in the cervix. The health care provider will do a physical exam and may monitor your contractions. If you are not in true labor, the exam should show that your cervix is not dilating and your water has not broken. °If there are no other health problems associated with your pregnancy, it is completely safe for you to be sent home with false labor. You may continue to have Braxton Hicks contractions until you go into true labor. °How to tell the difference between true labor and false labor °True labor °· Contractions last 30-70 seconds. °· Contractions become very regular. °· Discomfort is usually felt in the top of the uterus, and it spreads to the lower abdomen and low back. °· Contractions do not go away with walking. °· Contractions usually become more intense and increase in frequency. °· The cervix dilates and gets thinner. °False labor °· Contractions are usually shorter and not as strong as true labor contractions. °· Contractions are usually irregular. °· Contractions  are often felt in the front of the lower abdomen and in the groin. °· Contractions may go away when you walk around or change positions while lying down. °· Contractions get weaker and are shorter-lasting as time goes on. °· The cervix usually does not dilate or become thin. °Follow these instructions at home: ° °· Take over-the-counter and prescription medicines only as told by your health care provider. °· Keep up with your usual exercises and follow other instructions from your health care provider. °· Eat and drink lightly if you think you are going into labor. °· If Braxton Hicks contractions are making you uncomfortable: °? Change your position from lying down or resting to walking, or change from walking to resting. °? Sit and rest in a tub of warm water. °? Drink enough fluid to keep your urine pale yellow. Dehydration may cause these contractions. °? Do slow and deep breathing several times an hour. °· Keep all follow-up prenatal visits as told by your health care provider. This is important. °Contact a health care provider if: °· You have a fever. °· You have continuous pain in your abdomen. °Get help right away if: °· Your contractions become stronger, more regular, and closer together. °· You have fluid leaking or gushing from your vagina. °· You pass blood-tinged mucus (bloody show). °· You have bleeding from your vagina. °· You have low back pain that you never had before. °· You feel your baby’s head pushing down and causing pelvic pressure. °· Your baby is not moving inside you as much as it used to. °Summary °· Contractions that occur before labor are   called Braxton Hicks contractions, false labor, or practice contractions. °· Braxton Hicks contractions are usually shorter, weaker, farther apart, and less regular than true labor contractions. True labor contractions usually become progressively stronger and regular, and they become more frequent. °· Manage discomfort from Braxton Hicks contractions  by changing position, resting in a warm bath, drinking plenty of water, or practicing deep breathing. °This information is not intended to replace advice given to you by your health care provider. Make sure you discuss any questions you have with your health care provider. °Document Revised: 05/01/2017 Document Reviewed: 10/02/2016 °Elsevier Patient Education © 2020 [employer] ° °

## 2020-01-09 NOTE — MAU Note (Signed)
Presents with c/o ctxs since 0330 this morning.  States ctxs are approximately every 3 minutes.  Denies LOF or VB.  Reports +FM.

## 2020-01-09 NOTE — MAU Note (Signed)
I have communicated with Dr Clayborne Artist  and reviewed vital signs:  Vitals:   01/09/20 1152 01/09/20 1245  BP: 131/75 113/80  Pulse: 92 81  Resp: 18   Temp: 98.1 F (36.7 C)   SpO2: 99%     Vaginal exam:  Dilation: 2 Effacement (%): 70 Cervical Position: Middle Station: -3 Presentation: Vertex Exam by:: TLYTLE RN ,   Also reviewed contraction pattern and that non-stress test is reactive.  It has been documented that patient is not contracting with no cervical change not indicating active labor.  Patient denies any other complaints.  Based on this report provider has given order for discharge.  A discharge order and diagnosis entered by a provider.   Labor discharge instructions reviewed with patient.

## 2020-01-10 ENCOUNTER — Ambulatory Visit (INDEPENDENT_AMBULATORY_CARE_PROVIDER_SITE_OTHER): Payer: Medicaid Other

## 2020-01-10 VITALS — BP 118/80 | HR 81 | Wt 183.0 lb

## 2020-01-10 DIAGNOSIS — O99820 Streptococcus B carrier state complicating pregnancy: Secondary | ICD-10-CM

## 2020-01-10 DIAGNOSIS — Z3480 Encounter for supervision of other normal pregnancy, unspecified trimester: Secondary | ICD-10-CM

## 2020-01-10 DIAGNOSIS — Z3A38 38 weeks gestation of pregnancy: Secondary | ICD-10-CM

## 2020-01-10 NOTE — Progress Notes (Signed)
   PRENATAL VISIT NOTE  Subjective:  Robyn Stone is a 24 y.o. G3P1011 at [redacted]w[redacted]d being seen today for ongoing prenatal care.  She is currently monitored for the following issues for this low-risk pregnancy and has Cannabis abuse; Supervision of other normal pregnancy, antepartum; Chlamydia infection affecting pregnancy in first trimester; Hx of preeclampsia, prior pregnancy, currently pregnant, first trimester; Subchorionic hematoma in first trimester; Anemia in pregnancy; GBS (group B Streptococcus carrier), +RV culture, currently pregnant; Chlamydia infection affecting pregnancy in third trimester; and Uterine size date discrepancy pregnancy, third trimester on their problem list.  Patient reports contractions since 8/4.  Contractions: Irregular. Vag. Bleeding: None.  Movement: Present. Denies leaking of fluid.   The following portions of the patient's history were reviewed and updated as appropriate: allergies, current medications, past family history, past medical history, past social history, past surgical history and problem list.   Objective:   Vitals:   01/10/20 1026  BP: 118/80  Pulse: 81  Weight: 183 lb (83 kg)    Fetal Status: Fetal Heart Rate (bpm): 147 Fundal Height: 37 cm Movement: Present     General:  Alert, oriented and cooperative. Patient is in no acute distress.  Skin: Skin is warm and dry. No rash noted.   Cardiovascular: Normal heart rate noted  Respiratory: Normal respiratory effort, no problems with respiration noted  Abdomen: Soft, gravid, appropriate for gestational age.  Pain/Pressure: Present     Pelvic: Cervical exam deferred        Extremities: Normal range of motion.  Edema: None  Mental Status: Normal mood and affect. Normal behavior. Normal judgment and thought content.   Assessment and Plan:  Pregnancy: G3P1011 at [redacted]w[redacted]d 1. [redacted] weeks gestation of pregnancy   2. GBS (group B Streptococcus carrier), +RV culture, currently pregnant   3. Supervision  of other normal pregnancy, antepartum    -Patient reports constant contractions with no cervical change. Seen in MAU x4 this week. Requesting IOL. Discussed with patient elective IOL can only been done at 39 weeks or later. Reviewed risks of elective IOL including long induction, multiple interventions and long hospital stay. Patient verbalized understanding and desires IOL. Cervix favorable and multip. IOL scheduled 8/14 at 39 weeks. Discussed IOL process and instructed patient to wait for phone call to come to hospital -Counseled patient on routine COVID-19 testing for all admission to inpatient units whether direct admit or from MAU/ED. Reviewed reasons for testing including identifying cases and protecting patients/staff from possible infection. Reassured patient that separation from newborn is not required for COVID+ tests in asymptomatic patients and that the plan of care will be created with Peds through shared decision-making. One support person is allowed for all admitted patients regardless of COVID test results. All patient questions answered.    Term labor symptoms and general obstetric precautions including but not limited to vaginal bleeding, contractions, leaking of fluid and fetal movement were reviewed in detail with the patient. Please refer to After Visit Summary for other counseling recommendations.   Rolm Bookbinder, CNM 01/10/20 10:56 AM

## 2020-01-10 NOTE — Patient Instructions (Signed)
Safe Medications in Pregnancy   Acne: Benzoyl Peroxide Salicylic Acid  Backache/Headache: Tylenol: 2 regular strength every 4 hours OR              2 Extra strength every 6 hours  Colds/Coughs/Allergies: Benadryl (alcohol free) 25 mg every 6 hours as needed Breath right strips Claritin Cepacol throat lozenges Chloraseptic throat spray Cold-Eeze- up to three times per day Cough drops, alcohol free Flonase (by prescription only) Guaifenesin Mucinex Robitussin DM (plain only, alcohol free) Saline nasal spray/drops Sudafed (pseudoephedrine) & Actifed ** use only after [redacted] weeks gestation and if you do not have high blood pressure Tylenol Vicks Vaporub Zinc lozenges Zyrtec   Constipation: Colace Ducolax suppositories Fleet enema Glycerin suppositories Metamucil Milk of magnesia Miralax Senokot Smooth move tea  Diarrhea: Kaopectate Imodium A-D  *NO pepto Bismol  Hemorrhoids: Anusol Anusol HC Preparation H Tucks  Indigestion: Tums Maalox Mylanta Zantac  Pepcid  Insomnia: Benadryl (alcohol free) 25mg every 6 hours as needed Tylenol PM Unisom, no Gelcaps  Leg Cramps: Tums MagGel  Nausea/Vomiting:  Bonine Dramamine Emetrol Ginger extract Sea bands Meclizine  Nausea medication to take during pregnancy:  Unisom (doxylamine succinate 25 mg tablets) Take one tablet daily at bedtime. If symptoms are not adequately controlled, the dose can be increased to a maximum recommended dose of two tablets daily (1/2 tablet in the morning, 1/2 tablet mid-afternoon and one at bedtime). Vitamin B6 100mg tablets. Take one tablet twice a day (up to 200 mg per day).  Skin Rashes: Aveeno products Benadryl cream or 25mg every 6 hours as needed Calamine Lotion 1% cortisone cream  Yeast infection: Gyne-lotrimin 7 Monistat 7   **If taking multiple medications, please check labels to avoid duplicating the same active ingredients **take medication as directed on  the label ** Do not exceed 4000 mg of tylenol in 24 hours **Do not take medications that contain aspirin or ibuprofen       Coronavirus (COVID-19) and Pregnancy:  Frequently Asked Questions   How might coronavirus affect my pregnancy? The data for COVID-19 is limited, but we know that women with other coronavirus infections (such as SARS-CoV) did NOT have miscarriage or stillbirth at higher rates than the general population.  On the other hand, we know that having other respiratory viral infections during pregnancy, such as flu, has been associated with problems like low birth weight and preterm birth. Also, having a high fever early in pregnancy may increase the risk of certain birth defects.  Could I transmit coronavirus to my baby during pregnancy or delivery? Among the few case studies of infants born to mothers with COVID-19 published in peer-reviewed literature, none of the infants tested positive for the virus. And there have been no reports of mother-to-baby transmission for other coronaviruses (MERS-CoV and SARS-CoV). Also, there was no virus detected in samples of amniotic fluid or breast milk. But there have been a few reports of newborns as young as a few days old with infection, suggesting that a mother can transmit the infection to her infant through close contact after delivery.  Is it safe for me to deliver at a hospital where there have been COVID-19 cases? It should be. We know that COVID-19 is a very scary virus. The good news is that hospitals are taking great precautions to keep patients and healthcare providers safe.  According to the CDC guidelines, when a patient is even suspected to have COVID-19, they should be placed in a negative pressure room. (Think of these   rooms as vacuums that suck and filter the air so it's safe for the other people in the hospital.) If there are no rooms available, these patients should be asked to wait at home until they can be accomodated  safely. This should make it possible for you to deliver at the hospital without putting you or your baby at risk.  Hospitals are also implementing stricter visiting policies to keep patients safe. It's worth calling your hospital to check if there are any new regulations to be aware of.  What plans should I make now in case the hospital system is overwhelmed when it's time for me to deliver? Every hospital is making different plans for dealing with this scenario. Talk with your doctor or midwife once you're at least [redacted] weeks pregnant. I work in [employer], [employer].   I work in [employer], [employer]. Should I ask my doctor to excuse me from work until the baby is born? Should I ask my doctor to excuse me from work until the baby is born? What if I work in a school, the travel industry, or some other high-risk setting? Healthcare facilities should take care to limit the exposure of pregnant employees to patients with confirmed or suspected COVID-19, just as they would with other infectious cases. If you continue working, be sure to follow the CDC's risk assessment and infection control guidelines.  If you work in a school, travel industry, or other high-risk setting, talk with your employer about what it's doing to protect employees and minimize infection risks. Wash your hands often.   What if my OB gets COVID-19? If your doctor or midwife tests positive for COVID-19, they will need to be quarantined until they recover and are no longer at risk of transmitting the virus. In this case, you'll be assigned to another OB in your doctor's practice (or you may choose another practitioner yourself). Ask your new OB or your doctor's office if you should self-quarantine or be tested for the virus. (It will depend on when you last saw your provider and when that person tested positive.)  Should we hold off on trying to conceive because of COVID-19? At this time, there's no reason to hold off on trying to get pregnant, but the  data we have is really limited. For example, we don't think the virus causes birth defects or increases your risk of miscarriage. But we don't know for sure whether you could transmit COVID-19 to your baby before or during delivery. We also don't know if the virus lives in semen or can be sexually transmitted.  We have a babymoon scheduled in the next few months - should we cancel? Yes. At this time, the virus has reached more than 140 countries, and there are travel bans to [place], most of [place], and [place]. Places where large numbers of people gather are at highest risk, especially airports and cruise ships.  If you were planning travel in the [place] note that any travel setting increases your risk of exposure, and there are already many places where everyone is being asked to stay home. To see how the virus is spreading, check The [place] Times map based on CDC data.  For the most current advice to help you avoid exposure, check the CDC's COVID-19 travel page.  Will the hospital separate me from my newborn and keep the baby in quarantine? If you don't have COVID-19 and have not been exposed to the virus, the hospital will not separate you from your newborn. If you  do test positive for COVID-19 or have been exposed but have no symptoms, the CDC, American College of Obstetricians and Gynecologists, and the Society for Maternal-Fetal Medicine all recommend that you be separated from your baby to decrease the risk of transmission to the baby. This would last until you are no longer at risk of transmitting the virus.  This scenario would, of course, be beyond heartbreaking. Talk to the hospital, your baby's pediatrician, and your family about how to plan for care of your baby in the event that you have to be separated after delivery. And try to make sure you have the emotional support you would need to endure the sadness and stress of having to potentially wait weeks to meet your newborn.   My hospital is  restricting visitors and only allowing one support person. If my support person leaves after the delivery, will they be allowed to come back? Every hospital has different policies. Contact your hospital or labor and delivery unit a week or so before delivery to get the most up-to-date restrictions. In general, if your support person needs to leave, they would be allowed back unless they knew they were exposed to COVID-19 after leaving your company.  My mom was planning to fly here to help me care for my new baby after delivery. Should I tell her not to come? Yes. If your mom is over 60 or has any serious chronic medical conditions (such as heart disease, lung disease, or diabetes), she is at higher risk of serious illness from COVID-19 and should avoid air travel. And remember that any travel setting increases a person's risk of exposure. So, it may be risky to have her around the baby after she has been traveling. For the most current advice on traveling, check the CDC's COVID-19 travel page. 

## 2020-01-11 ENCOUNTER — Telehealth (HOSPITAL_COMMUNITY): Payer: Self-pay | Admitting: *Deleted

## 2020-01-11 ENCOUNTER — Encounter (HOSPITAL_COMMUNITY): Payer: Self-pay | Admitting: *Deleted

## 2020-01-11 ENCOUNTER — Other Ambulatory Visit: Payer: Self-pay | Admitting: Advanced Practice Midwife

## 2020-01-11 NOTE — Telephone Encounter (Signed)
Preadmission screen  

## 2020-01-12 ENCOUNTER — Other Ambulatory Visit (HOSPITAL_COMMUNITY)
Admission: RE | Admit: 2020-01-12 | Discharge: 2020-01-12 | Disposition: A | Discharge status: Home / Self Care | Payer: Medicaid Other | Source: Ambulatory Visit | Attending: Family Medicine | Admitting: Family Medicine

## 2020-01-12 DIAGNOSIS — Z20822 Contact with and (suspected) exposure to covid-19: Secondary | ICD-10-CM | POA: Insufficient documentation

## 2020-01-12 DIAGNOSIS — Z01812 Encounter for preprocedural laboratory examination: Secondary | ICD-10-CM | POA: Diagnosis not present

## 2020-01-12 LAB — SARS CORONAVIRUS 2 (TAT 6-24 HRS): SARS Coronavirus 2: NEGATIVE

## 2020-01-14 ENCOUNTER — Inpatient Hospital Stay (HOSPITAL_COMMUNITY): Payer: Medicaid Other | Admitting: Anesthesiology

## 2020-01-14 ENCOUNTER — Other Ambulatory Visit: Payer: Self-pay

## 2020-01-14 ENCOUNTER — Inpatient Hospital Stay (HOSPITAL_COMMUNITY)
Admission: AD | Admit: 2020-01-14 | Discharge: 2020-01-16 | DRG: 806 | Disposition: A | Discharge status: Home / Self Care | Payer: Medicaid Other | Attending: Family Medicine | Admitting: Family Medicine

## 2020-01-14 ENCOUNTER — Encounter (HOSPITAL_COMMUNITY): Payer: Self-pay | Admitting: Family Medicine

## 2020-01-14 ENCOUNTER — Inpatient Hospital Stay (HOSPITAL_COMMUNITY): Payer: Medicaid Other

## 2020-01-14 DIAGNOSIS — O99324 Drug use complicating childbirth: Secondary | ICD-10-CM | POA: Diagnosis present

## 2020-01-14 DIAGNOSIS — O9902 Anemia complicating childbirth: Secondary | ICD-10-CM | POA: Diagnosis not present

## 2020-01-14 DIAGNOSIS — Z3A39 39 weeks gestation of pregnancy: Secondary | ICD-10-CM | POA: Diagnosis not present

## 2020-01-14 DIAGNOSIS — A749 Chlamydial infection, unspecified: Secondary | ICD-10-CM

## 2020-01-14 DIAGNOSIS — Z3490 Encounter for supervision of normal pregnancy, unspecified, unspecified trimester: Secondary | ICD-10-CM | POA: Diagnosis present

## 2020-01-14 DIAGNOSIS — Z87891 Personal history of nicotine dependence: Secondary | ICD-10-CM

## 2020-01-14 DIAGNOSIS — O99824 Streptococcus B carrier state complicating childbirth: Secondary | ICD-10-CM | POA: Diagnosis present

## 2020-01-14 DIAGNOSIS — D509 Iron deficiency anemia, unspecified: Secondary | ICD-10-CM | POA: Diagnosis not present

## 2020-01-14 DIAGNOSIS — F1210 Cannabis abuse, uncomplicated: Secondary | ICD-10-CM | POA: Diagnosis present

## 2020-01-14 DIAGNOSIS — O26893 Other specified pregnancy related conditions, third trimester: Secondary | ICD-10-CM | POA: Diagnosis not present

## 2020-01-14 DIAGNOSIS — O98813 Other maternal infectious and parasitic diseases complicating pregnancy, third trimester: Secondary | ICD-10-CM

## 2020-01-14 DIAGNOSIS — Z30430 Encounter for insertion of intrauterine contraceptive device: Secondary | ICD-10-CM

## 2020-01-14 DIAGNOSIS — D649 Anemia, unspecified: Secondary | ICD-10-CM | POA: Diagnosis not present

## 2020-01-14 DIAGNOSIS — O99820 Streptococcus B carrier state complicating pregnancy: Secondary | ICD-10-CM

## 2020-01-14 LAB — CBC
HCT: 30 % — ABNORMAL LOW (ref 36.0–46.0)
Hemoglobin: 9.5 g/dL — ABNORMAL LOW (ref 12.0–15.0)
MCH: 25.6 pg — ABNORMAL LOW (ref 26.0–34.0)
MCHC: 31.7 g/dL (ref 30.0–36.0)
MCV: 80.9 fL (ref 80.0–100.0)
Platelets: 299 10*3/uL (ref 150–400)
RBC: 3.71 MIL/uL — ABNORMAL LOW (ref 3.87–5.11)
RDW: 15.8 % — ABNORMAL HIGH (ref 11.5–15.5)
WBC: 8.1 10*3/uL (ref 4.0–10.5)
nRBC: 0 % (ref 0.0–0.2)

## 2020-01-14 LAB — TYPE AND SCREEN
ABO/RH(D): A POS
Antibody Screen: NEGATIVE

## 2020-01-14 MED ORDER — OXYCODONE-ACETAMINOPHEN 5-325 MG PO TABS: 1.0000 | ORAL_TABLET | ORAL | Status: DC | PRN

## 2020-01-14 MED ORDER — OXYTOCIN-SODIUM CHLORIDE 30-0.9 UT/500ML-% IV SOLN: 2.5000 [IU]/h | INTRAVENOUS | Status: DC

## 2020-01-14 MED ORDER — FENTANYL CITRATE (PF) 100 MCG/2ML IJ SOLN
100.0000 ug | INTRAMUSCULAR | Status: DC | PRN
  Administered 2020-01-14 (×2): 100 ug via INTRAVENOUS
  Filled 2020-01-14 (×2): qty 2

## 2020-01-14 MED ORDER — FENTANYL-BUPIVACAINE-NACL 0.5-0.125-0.9 MG/250ML-% EP SOLN
12.0000 mL/h | EPIDURAL | Status: DC | PRN
  Filled 2020-01-14: qty 250

## 2020-01-14 MED ORDER — TERBUTALINE SULFATE 1 MG/ML IJ SOLN: 0.2500 mg | Freq: Once | INTRAMUSCULAR | Status: DC | PRN

## 2020-01-14 MED ORDER — LIDOCAINE HCL (PF) 1 % IJ SOLN: 30.0000 mL | INTRAMUSCULAR | Status: DC | PRN

## 2020-01-14 MED ORDER — OXYTOCIN BOLUS FROM INFUSION
333.0000 mL | Freq: Once | INTRAVENOUS | Status: AC
  Administered 2020-01-15: 333 mL via INTRAVENOUS

## 2020-01-14 MED ORDER — LACTATED RINGERS IV SOLN: 500.0000 mL | Freq: Once | INTRAVENOUS | Status: DC

## 2020-01-14 MED ORDER — PENICILLIN G POT IN DEXTROSE 60000 UNIT/ML IV SOLN
3.0000 10*6.[IU] | INTRAVENOUS | Status: DC
  Administered 2020-01-14 (×2): 3 10*6.[IU] via INTRAVENOUS
  Filled 2020-01-14 (×2): qty 50

## 2020-01-14 MED ORDER — FLEET ENEMA 7-19 GM/118ML RE ENEM: 1.0000 | ENEMA | RECTAL | Status: DC | PRN

## 2020-01-14 MED ORDER — EPHEDRINE 5 MG/ML INJ: 10.0000 mg | INTRAVENOUS | Status: DC | PRN

## 2020-01-14 MED ORDER — LACTATED RINGERS IV SOLN: INTRAVENOUS | Status: DC

## 2020-01-14 MED ORDER — OXYTOCIN-SODIUM CHLORIDE 30-0.9 UT/500ML-% IV SOLN
1.0000 m[IU]/min | INTRAVENOUS | Status: DC
  Administered 2020-01-14: 2 m[IU]/min via INTRAVENOUS
  Filled 2020-01-14: qty 500

## 2020-01-14 MED ORDER — MISOPROSTOL 50MCG HALF TABLET
50.0000 ug | ORAL_TABLET | ORAL | Status: DC | PRN
  Administered 2020-01-14: 50 ug via BUCCAL
  Filled 2020-01-14: qty 1

## 2020-01-14 MED ORDER — OXYCODONE-ACETAMINOPHEN 5-325 MG PO TABS: 2.0000 | ORAL_TABLET | ORAL | Status: DC | PRN

## 2020-01-14 MED ORDER — ONDANSETRON HCL 4 MG/2ML IJ SOLN: 4.0000 mg | Freq: Four times a day (QID) | INTRAMUSCULAR | Status: DC | PRN

## 2020-01-14 MED ORDER — LEVONORGESTREL 19.5 MCG/DAY IU IUD
INTRAUTERINE_SYSTEM | Freq: Once | INTRAUTERINE | Status: AC
  Administered 2020-01-15: 1 via INTRAUTERINE
  Filled 2020-01-14: qty 1

## 2020-01-14 MED ORDER — LACTATED RINGERS IV SOLN
500.0000 mL | INTRAVENOUS | Status: DC | PRN
  Administered 2020-01-15: 500 mL via INTRAVENOUS

## 2020-01-14 MED ORDER — SODIUM CHLORIDE 0.9 % IV SOLN
5.0000 10*6.[IU] | Freq: Once | INTRAVENOUS | Status: AC
  Administered 2020-01-14: 5 10*6.[IU] via INTRAVENOUS
  Filled 2020-01-14: qty 5

## 2020-01-14 MED ORDER — PHENYLEPHRINE 40 MCG/ML (10ML) SYRINGE FOR IV PUSH (FOR BLOOD PRESSURE SUPPORT): 80.0000 ug | PREFILLED_SYRINGE | INTRAVENOUS | Status: DC | PRN

## 2020-01-14 MED ORDER — ACETAMINOPHEN 325 MG PO TABS: 650.0000 mg | ORAL_TABLET | ORAL | Status: DC | PRN

## 2020-01-14 MED ORDER — DIPHENHYDRAMINE HCL 50 MG/ML IJ SOLN: 12.5000 mg | INTRAMUSCULAR | Status: DC | PRN

## 2020-01-14 MED ORDER — SOD CITRATE-CITRIC ACID 500-334 MG/5ML PO SOLN: 30.0000 mL | ORAL | Status: DC | PRN

## 2020-01-14 NOTE — Progress Notes (Signed)
Labor Progress Note Robyn Stone is a 24 y.o. G3P1011 at [redacted]w[redacted]d presented for elective IOL.  S: no significant increase in contraction strength, no increased pelvic pressure. No new symptoms.    O:  BP 123/75   Pulse 72   Temp 98.2 F (36.8 C) (Oral)   Resp 16   Ht 5\' 2"  (1.575 m)   Wt 83.1 kg   LMP 04/16/2019   BMI 33.53 kg/m  EFM: 140/moderate var/pos accels, no decels  CVE: Dilation: 1 Effacement (%): 10 Station: -3 Presentation: Vertex Exam by:: Dr. [email or web address]   A&P: 24 y.o. 25 [redacted]w[redacted]d here for elective IOL.  #Labor: Progressing well. FB out and s/p 1 cytotec.  Will perform next exam at 1800 and plan to start pit. #Pain: epidural upon request. #FWB: Cat I #GBS Positive on PCN   [redacted]w[redacted]d, MD 5:34 PM

## 2020-01-14 NOTE — Anesthesia Preprocedure Evaluation (Signed)
Anesthesia Evaluation  Patient identified by MRN, date of birth, ID band Patient awake    Reviewed: Allergy & Precautions, NPO status , Patient's Chart, lab work & pertinent test results  Airway Mallampati: II  TM Distance: >3 FB Neck ROM: Full    Dental no notable dental hx.    Pulmonary neg pulmonary ROS, former smoker,    Pulmonary exam normal breath sounds clear to auscultation       Cardiovascular negative cardio ROS Normal cardiovascular exam Rhythm:Regular Rate:Normal     Neuro/Psych  Headaches, PSYCHIATRIC DISORDERS Anxiety    GI/Hepatic negative GI ROS, Neg liver ROS,   Endo/Other  negative endocrine ROS  Renal/GU negative Renal ROS  negative genitourinary   Musculoskeletal negative musculoskeletal ROS (+)   Abdominal   Peds  Hematology  (+) Blood dyscrasia (Hgb 9.5), anemia ,   Anesthesia Other Findings Elective IOL  Reproductive/Obstetrics (+) Pregnancy                             Anesthesia Physical Anesthesia Plan  ASA: II  Anesthesia Plan: Epidural   Post-op Pain Management:    Induction:   PONV Risk Score and Plan: Treatment may vary due to age or medical condition  Airway Management Planned: Natural Airway  Additional Equipment:   Intra-op Plan:   Post-operative Plan:   Informed Consent: I have reviewed the patients History and Physical, chart, labs and discussed the procedure including the risks, benefits and alternatives for the proposed anesthesia with the patient or authorized representative who has indicated his/her understanding and acceptance.       Plan Discussed with: Anesthesiologist  Anesthesia Plan Comments: (Patient identified. Risks, benefits, options discussed with patient including but not limited to bleeding, infection, nerve damage, paralysis, failed block, incomplete pain control, headache, blood pressure changes, nausea, vomiting,  reactions to medication, itching, and post partum back pain. Confirmed with bedside nurse the patient's most recent platelet count. Confirmed with the patient that they are not taking any anticoagulation, have any bleeding history or any family history of bleeding disorders. Patient expressed understanding and wishes to proceed. All questions were answered. )        Anesthesia Quick Evaluation

## 2020-01-14 NOTE — H&P (Addendum)
OBSTETRIC ADMISSION HISTORY AND PHYSICAL  Robyn Stone is a 24 y.o. female G3P1011 with IUP at [redacted]w[redacted]d by LMP presenting for IOL for elective induction. She reports +FMs, No LOF, no VB, no blurry vision, headaches or peripheral edema, and RUQ pain.  She plans on breast and bottle  feeding. She is not sure what she will do for birth control. She received her prenatal care at  [facility]    Dating: By LMP --->  Estimated Date of Delivery: 01/21/20  Sono:  @[redacted]w[redacted]d , CWD, normal anatomy, cephalic presentation,  2820g, 32% EFW   Prenatal History/Complications:  Chlamydia infection (TOC 7/6, pos again on 7/27, was treated, has not had another TOC) Hx of Pre-eclampsia in prior pregnancy  Size to dates discrepancy  Past Medical History: Past Medical History:  Diagnosis Date   Anxiety    Headache    Medical history non-contributory    Pre-eclampsia    Pregnancy induced hypertension     Past Surgical History: Past Surgical History:  Procedure Laterality Date   NO PAST SURGERIES      Obstetrical History: OB History     Gravida  3   Para  1   Term  1   Preterm      AB  1   Living  1      SAB  1   TAB      Ectopic      Multiple  0   Live Births  1           Social History: Social History   Socioeconomic History   Marital status: Single    Spouse name: Not on file   Number of children: Not on file   Years of education: Not on file   Highest education level: Not on file  Occupational History   Not on file  Tobacco Use   Smoking status: Former Smoker    Types: Cigarettes    Quit date: 08/01/2019    Years since quitting: 0.4   Smokeless tobacco: Never Used   Tobacco comment: 1/2 day  Vaping Use   Vaping Use: Never used  Substance and Sexual Activity   Alcohol use: Not Currently    Alcohol/week: 0.0 standard drinks    Comment: occ   Drug use: Not Currently    Types: Marijuana    Comment: mid June   Sexual activity: Not  Currently    Birth control/protection: None  Other Topics Concern   Not on file  Social History Narrative   Not on file   Social Determinants of Health   Financial Resource Strain:    Difficulty of Paying Living Expenses:   Food Insecurity:    Worried About July in the Last Year:    Programme researcher, broadcasting/film/video in the Last Year:   Transportation Needs:    Barista (Medical):    Lack of Transportation (Non-Medical):   Physical Activity:    Days of Exercise per Week:    Minutes of Exercise per Session:   Stress:    Feeling of Stress :   Social Connections:    Frequency of Communication with Friends and Family:    Frequency of Social Gatherings with Friends and Family:    Attends Religious Services:    Active Member of Clubs or Organizations:    Attends Freight forwarder Meetings:    Marital Status:     Family History: Family History  Problem Relation Age of Onset  Hypertension Mother     Allergies: No Known Allergies  Medications Prior to Admission  Medication Sig Dispense Refill Last Dose   aspirin EC 81 MG tablet Take 1 tablet (81 mg total) by mouth daily. (Patient not taking: Reported on 12/06/2019) 90 tablet 1    cyclobenzaprine (FLEXERIL) 5 MG tablet Take 1-2 tablets (5-10 mg total) by mouth 3 (three) times daily as needed for muscle spasms. 10 tablet 0    Elastic Bandages & Supports (COMFORT FIT MATERNITY SUPP LG) MISC 1 each by Does not apply route daily as needed. (Patient not taking: Reported on 12/27/2019) 1 each 0    ferrous sulfate 325 (65 FE) MG EC tablet Take 1 tablet (325 mg total) by mouth 2 (two) times daily with a meal. 45 tablet 3    pantoprazole (PROTONIX) 20 MG tablet Take 1 tablet (20 mg total) by mouth daily. 30 tablet 2    Prenatal Vit-Fe Fumarate-FA (PRENATAL COMPLETE) 14-0.4 MG TABS Take 1 tablet by mouth daily. (Patient not taking: Reported on 12/27/2019) 60 each 0      Review of Systems   All systems  reviewed and negative except as stated in HPI  Blood pressure 115/74, pulse 94, temperature 98.2 F (36.8 C), temperature source Oral, resp. rate 18, height 5\' 2"  (1.575 m), weight 83.1 kg, last menstrual period 04/16/2019, unknown if currently breastfeeding. General appearance: alert, cooperative and appears stated age Lungs: clear to auscultation bilaterally Heart: regular rate and rhythm Abdomen: soft, non-tender; bowel sounds normal Pelvic: adequate 1/10/-2 Extremities: no sign of DVT Presentation: cephalic Fetal monitoringBaseline: 135 bpm, Variability: Good {> 6 bpm), Accelerations: Reactive and Decelerations: Absent Uterine activityNone Dilation: 1 Effacement (%): 10 Station: -3 Exam by:: Dr. [email or web address]   Prenatal labs: ABO, Rh: --/--/PENDING (08/14 1230) Antibody: PENDING (08/14 1230) Rubella: 1.01 (02/10 1014) RPR: NON-REACTIVE (06/23 1030)  HBsAg: NON-REACTIVE (02/10 1014)  HIV: NON-REACTIVE (06/23 1030)  GBS:    1 hr Glucola normal Genetic screening  Low risk Anatomy 04-11-1973 normal  Prenatal Transfer Tool  Maternal Diabetes: No Genetic Screening: Declined Maternal Ultrasounds/Referrals: Normal Fetal Ultrasounds or other Referrals:  None Maternal Substance Abuse:  Yes:  Type: Marijuana Significant Maternal Medications:  None Significant Maternal Lab Results: Group B Strep negative  Results for orders placed or performed during the hospital encounter of 01/14/20 (from the past 24 hour(s))  CBC   Collection Time: 01/14/20 12:30 PM  Result Value Ref Range   WBC 8.1 4.0 - 10.5 K/uL   RBC 3.71 (L) 3.87 - 5.11 MIL/uL   Hemoglobin 9.5 (L) 12.0 - 15.0 g/dL   HCT 01/16/20 (L) 36 - 46 %   MCV 80.9 80.0 - 100.0 fL   MCH 25.6 (L) 26.0 - 34.0 pg   MCHC 31.7 30.0 - 36.0 g/dL   RDW 72.5 (H) 36.6 - 44.0 %   Platelets 299 150 - 400 K/uL   nRBC 0.0 0.0 - 0.2 %  Type and screen   Collection Time: 01/14/20 12:30 PM  Result Value Ref Range   ABO/RH(D) PENDING    Antibody Screen  PENDING    Sample Expiration      01/17/2020,2359 Performed at [facility], [address] [place], [place]     Patient Active Problem List   Diagnosis Date Noted   Encounter for induction of labor 01/14/2020   Uterine size date discrepancy pregnancy, third trimester 01/09/2020   GBS (group B Streptococcus carrier), +RV culture, currently pregnant 12/29/2019  Chlamydia infection affecting pregnancy in third trimester 12/29/2019   Anemia in pregnancy 11/25/2019   Chlamydia infection affecting pregnancy in first trimester 07/13/2019   Hx of preeclampsia, prior pregnancy, currently pregnant, first trimester 07/13/2019   Subchorionic hematoma in first trimester 07/13/2019   Supervision of other normal pregnancy, antepartum 06/23/2019   Cannabis abuse 04/05/2014    Assessment/Plan:  ERCIE ELIASEN is a 24 y.o. G3P1011 at [redacted]w[redacted]d here for elective IOL. Has iron deficiency anemia  #Labor: Will start induction with cytotec and FB. Will check again in 4 hours. #Pain: Would like epidural #FWB: Cat I #ID:  GBS pos, PCN #Chlamydia positive on 7/23: no f/u TOC. #MOF: both #MOC:unsure #Circ:  yes #anemia: last hgb 9.2 (8/3)  Mirian Mo, MD  01/14/2020, 1:14 PM   I saw and evaluated the patient. I agree with the findings and the plan of care as documented in the residents note.  Casper Harrison, MD OB Family Medicine Fellow, [facility] for [employer], [facility]  Attestation of Attending Supervision of [place] Fellow: Evaluation and management procedures were performed by the [facility] Fellow under my supervision and collaboration.  I have reviewed the OB Fellow's note and chart, and I agree with the management and plan.  Reva Bores, MD Center for [facility] Attending 01/17/2020 2:26 PM

## 2020-01-14 NOTE — Progress Notes (Addendum)
OBSTETRIC ADMISSION HISTORY AND PHYSICAL  Robyn Stone is a 24 y.o. female G3P1011 with IUP at [redacted]w[redacted]d by LMP presenting for IOL for elective induction. She reports +FMs, No LOF, no VB, no blurry vision, headaches or peripheral edema, and RUQ pain.  She plans on breast and bottle  feeding. She is not sure what she will do for birth control. She received her prenatal care at  [facility]    Dating: By LMP --->  Estimated Date of Delivery: 01/21/20  Sono:  @[redacted]w[redacted]d , CWD, normal anatomy, cephalic presentation,  2820g, 32% EFW   Prenatal History/Complications:  Chlamydia infection (TOC 7/6, pos again on 7/27, was treated, has not had another TOC) Hx of Pre-eclampsia in prior pregnancy  Size to dates discrepancy  Past Medical History: Past Medical History:  Diagnosis Date  . Anxiety   . Headache   . Medical history non-contributory   . Pre-eclampsia   . Pregnancy induced hypertension     Past Surgical History: Past Surgical History:  Procedure Laterality Date  . NO PAST SURGERIES      Obstetrical History: OB History     Gravida  3   Para  1   Term  1   Preterm      AB  1   Living  1      SAB  1   TAB      Ectopic      Multiple  0   Live Births  1           Social History: Social History   Socioeconomic History  . Marital status: Single    Spouse name: Not on file  . Number of children: Not on file  . Years of education: Not on file  . Highest education level: Not on file  Occupational History  . Not on file  Tobacco Use  . Smoking status: Former Smoker    Types: Cigarettes    Quit date: 08/01/2019    Years since quitting: 0.4  . Smokeless tobacco: Never Used  . Tobacco comment: 1/2 day  Vaping Use  . Vaping Use: Never used  Substance and Sexual Activity  . Alcohol use: Not Currently    Alcohol/week: 0.0 standard drinks    Comment: occ  . Drug use: Not Currently    Types: Marijuana    Comment: mid June  . Sexual activity: Not  Currently    Birth control/protection: None  Other Topics Concern  . Not on file  Social History Narrative  . Not on file   Social Determinants of Health   Financial Resource Strain:   . Difficulty of Paying Living Expenses:   Food Insecurity:   . Worried About July in the Last Year:   . Programme researcher, broadcasting/film/video in the Last Year:   Transportation Needs:   . Barista (Medical):   Freight forwarder Lack of Transportation (Non-Medical):   Physical Activity:   . Days of Exercise per Week:   . Minutes of Exercise per Session:   Stress:   . Feeling of Stress :   Social Connections:   . Frequency of Communication with Friends and Family:   . Frequency of Social Gatherings with Friends and Family:   . Attends Religious Services:   . Active Member of Clubs or Organizations:   . Attends [employer] Meetings:   Banker Marital Status:     Family History: Family History  Problem Relation Age of Onset  .  Hypertension Mother     Allergies: No Known Allergies  Medications Prior to Admission  Medication Sig Dispense Refill Last Dose  . aspirin EC 81 MG tablet Take 1 tablet (81 mg total) by mouth daily. (Patient not taking: Reported on 12/06/2019) 90 tablet 1   . cyclobenzaprine (FLEXERIL) 5 MG tablet Take 1-2 tablets (5-10 mg total) by mouth 3 (three) times daily as needed for muscle spasms. 10 tablet 0   . Elastic Bandages & Supports (COMFORT FIT MATERNITY SUPP LG) MISC 1 each by Does not apply route daily as needed. (Patient not taking: Reported on 12/27/2019) 1 each 0   . ferrous sulfate 325 (65 FE) MG EC tablet Take 1 tablet (325 mg total) by mouth 2 (two) times daily with a meal. 45 tablet 3   . pantoprazole (PROTONIX) 20 MG tablet Take 1 tablet (20 mg total) by mouth daily. 30 tablet 2   . Prenatal Vit-Fe Fumarate-FA (PRENATAL COMPLETE) 14-0.4 MG TABS Take 1 tablet by mouth daily. (Patient not taking: Reported on 12/27/2019) 60 each 0      Review of Systems   All systems  reviewed and negative except as stated in HPI  Blood pressure 115/74, pulse 94, temperature 98.2 F (36.8 C), temperature source Oral, resp. rate 18, height 5\' 2"  (1.575 m), weight 83.1 kg, last menstrual period 04/16/2019, unknown if currently breastfeeding. General appearance: alert, cooperative and appears stated age Lungs: clear to auscultation bilaterally Heart: regular rate and rhythm Abdomen: soft, non-tender; bowel sounds normal Pelvic: adequate 1/10/-2 Extremities: no sign of DVT Presentation: cephalic Fetal monitoringBaseline: 135 bpm, Variability: Good {> 6 bpm), Accelerations: Reactive and Decelerations: Absent Uterine activityNone Dilation: 1 Effacement (%): 10 Station: -3 Exam by:: Dr. [email or web address]   Prenatal labs: ABO, Rh: --/--/PENDING (08/14 1230) Antibody: PENDING (08/14 1230) Rubella: 1.01 (02/10 1014) RPR: NON-REACTIVE (06/23 1030)  HBsAg: NON-REACTIVE (02/10 1014)  HIV: NON-REACTIVE (06/23 1030)  GBS:    1 hr Glucola normal Genetic screening  Low risk Anatomy 04-11-1973 normal  Prenatal Transfer Tool  Maternal Diabetes: No Genetic Screening: Declined Maternal Ultrasounds/Referrals: Normal Fetal Ultrasounds or other Referrals:  None Maternal Substance Abuse:  Yes:  Type: Marijuana Significant Maternal Medications:  None Significant Maternal Lab Results: Group B Strep negative  Results for orders placed or performed during the hospital encounter of 01/14/20 (from the past 24 hour(s))  CBC   Collection Time: 01/14/20 12:30 PM  Result Value Ref Range   WBC 8.1 4.0 - 10.5 K/uL   RBC 3.71 (L) 3.87 - 5.11 MIL/uL   Hemoglobin 9.5 (L) 12.0 - 15.0 g/dL   HCT 01/16/20 (L) 36 - 46 %   MCV 80.9 80.0 - 100.0 fL   MCH 25.6 (L) 26.0 - 34.0 pg   MCHC 31.7 30.0 - 36.0 g/dL   RDW 16.1 (H) 09.6 - 04.5 %   Platelets 299 150 - 400 K/uL   nRBC 0.0 0.0 - 0.2 %  Type and screen   Collection Time: 01/14/20 12:30 PM  Result Value Ref Range   ABO/RH(D) PENDING    Antibody Screen  PENDING    Sample Expiration      01/17/2020,2359 Performed at [facility], [address] [place], [place]     Patient Active Problem List   Diagnosis Date Noted  . Encounter for induction of labor 01/14/2020  . Uterine size date discrepancy pregnancy, third trimester 01/09/2020  . GBS (group B Streptococcus carrier), +RV culture, currently pregnant 12/29/2019  .  Chlamydia infection affecting pregnancy in third trimester 12/29/2019  . Anemia in pregnancy 11/25/2019  . Chlamydia infection affecting pregnancy in first trimester 07/13/2019  . Hx of preeclampsia, prior pregnancy, currently pregnant, first trimester 07/13/2019  . Subchorionic hematoma in first trimester 07/13/2019  . Supervision of other normal pregnancy, antepartum 06/23/2019  . Cannabis abuse 04/05/2014    Assessment/Plan:  Robyn Stone is a 24 y.o. G3P1011 at [redacted]w[redacted]d here for elective IOL.  #Labor: Will start induction with cytotec and FB. Will check again in 4 hours. #Pain: Would like epidural #FWB: Cat I #ID:  GBS pos, PCN #Chlamydia positive on 7/23: no f/u TOC. #MOF: both #MOC:unsure #Circ:  yes #anemia: last hgb 9.2 (8/3)  Mirian Mo, MD  01/14/2020, 1:14 PM   I saw and evaluated the patient. I agree with the findings and the plan of care as documented in the resident's note.  Casper Harrison, MD [facility] Family Medicine Fellow, [facility] for [facility], [facility]

## 2020-01-15 ENCOUNTER — Encounter (HOSPITAL_COMMUNITY): Payer: Self-pay | Admitting: Obstetrics and Gynecology

## 2020-01-15 DIAGNOSIS — Z3A39 39 weeks gestation of pregnancy: Secondary | ICD-10-CM

## 2020-01-15 DIAGNOSIS — O99824 Streptococcus B carrier state complicating childbirth: Secondary | ICD-10-CM

## 2020-01-15 LAB — RPR: RPR Ser Ql: NONREACTIVE

## 2020-01-15 MED ORDER — BENZOCAINE-MENTHOL 20-0.5 % EX AERO: 1.0000 "application " | INHALATION_SPRAY | CUTANEOUS | Status: DC | PRN

## 2020-01-15 MED ORDER — WITCH HAZEL-GLYCERIN EX PADS: 1.0000 "application " | MEDICATED_PAD | CUTANEOUS | Status: DC | PRN

## 2020-01-15 MED ORDER — LIDOCAINE HCL (PF) 1 % IJ SOLN
INTRAMUSCULAR | Status: DC | PRN
  Administered 2020-01-15: 12 mL via EPIDURAL

## 2020-01-15 MED ORDER — ONDANSETRON HCL 4 MG/2ML IJ SOLN: 4.0000 mg | INTRAMUSCULAR | Status: DC | PRN

## 2020-01-15 MED ORDER — ONDANSETRON HCL 4 MG PO TABS: 4.0000 mg | ORAL_TABLET | ORAL | Status: DC | PRN

## 2020-01-15 MED ORDER — TETANUS-DIPHTH-ACELL PERTUSSIS 5-2.5-18.5 LF-MCG/0.5 IM SUSP: 0.5000 mL | Freq: Once | INTRAMUSCULAR | Status: DC

## 2020-01-15 MED ORDER — SODIUM CHLORIDE (PF) 0.9 % IJ SOLN
INTRAMUSCULAR | Status: DC | PRN
  Administered 2020-01-15: 12 mL/h via EPIDURAL

## 2020-01-15 MED ORDER — SIMETHICONE 80 MG PO CHEW: 80.0000 mg | CHEWABLE_TABLET | ORAL | Status: DC | PRN

## 2020-01-15 MED ORDER — SENNOSIDES-DOCUSATE SODIUM 8.6-50 MG PO TABS
2.0000 | ORAL_TABLET | ORAL | Status: DC
  Administered 2020-01-16: 2 via ORAL
  Filled 2020-01-15: qty 2

## 2020-01-15 MED ORDER — IBUPROFEN 600 MG PO TABS
600.0000 mg | ORAL_TABLET | Freq: Four times a day (QID) | ORAL | Status: DC
  Administered 2020-01-15 – 2020-01-16 (×5): 600 mg via ORAL
  Filled 2020-01-15 (×5): qty 1

## 2020-01-15 MED ORDER — PRENATAL MULTIVITAMIN CH
1.0000 | ORAL_TABLET | Freq: Every day | ORAL | Status: DC
  Administered 2020-01-15 – 2020-01-16 (×2): 1 via ORAL
  Filled 2020-01-15 (×2): qty 1

## 2020-01-15 MED ORDER — DIPHENHYDRAMINE HCL 25 MG PO CAPS: 25.0000 mg | ORAL_CAPSULE | Freq: Four times a day (QID) | ORAL | Status: DC | PRN

## 2020-01-15 MED ORDER — DIBUCAINE (PERIANAL) 1 % EX OINT: 1.0000 "application " | TOPICAL_OINTMENT | CUTANEOUS | Status: DC | PRN

## 2020-01-15 MED ORDER — COCONUT OIL OIL
1.0000 "application " | TOPICAL_OIL | Status: DC | PRN
  Administered 2020-01-15: 1 via TOPICAL

## 2020-01-15 MED ORDER — ZOLPIDEM TARTRATE 5 MG PO TABS: 5.0000 mg | ORAL_TABLET | Freq: Every evening | ORAL | Status: DC | PRN

## 2020-01-15 MED ORDER — ACETAMINOPHEN 325 MG PO TABS
650.0000 mg | ORAL_TABLET | ORAL | Status: DC | PRN
  Administered 2020-01-15 (×3): 650 mg via ORAL
  Filled 2020-01-15 (×3): qty 2

## 2020-01-15 NOTE — Progress Notes (Signed)
LABOR PROGRESS NOTE  Robyn Stone is a 24 y.o. G3P1011 at [redacted]w[redacted]d  admitted for elective IOL   Subjective: Patient doing well, breathing through contractions, plan to get epidural soon   Objective: BP (!) 129/92   Pulse 82   Temp 97.9 F (36.6 C) (Oral)   Resp 18   Ht 5\' 2"  (1.575 m)   Wt 83.1 kg   LMP 04/16/2019   SpO2 99%   BMI 33.53 kg/m  or  Vitals:   01/14/20 2230 01/14/20 2327 01/14/20 2330 01/15/20 0000  BP: (!) 141/72  127/89 (!) 129/92  Pulse: 82  77 82  Resp: 18  18 18   Temp:  97.9 F (36.6 C)    TempSrc:  Oral    SpO2:    99%  Weight:      Height:        AROM@2324 - clear fluid  Currently on 6 milli-unit/min of pitocin  Dilation: 5 Effacement (%): 50 Station: -3 Presentation: Vertex Exam by:: V Rodgers CNM  FHT: baseline rate 130, moderate varibility, +accel, early decel Toco: 1-3  Labs: Lab Results  Component Value Date   WBC 8.1 01/14/2020   HGB 9.5 (L) 01/14/2020   HCT 30.0 (L) 01/14/2020   MCV 80.9 01/14/2020   PLT 299 01/14/2020    Patient Active Problem List   Diagnosis Date Noted  . Encounter for induction of labor 01/14/2020  . Uterine size date discrepancy pregnancy, third trimester 01/09/2020  . GBS (group B Streptococcus carrier), +RV culture, currently pregnant 12/29/2019  . Chlamydia infection affecting pregnancy in third trimester 12/29/2019  . Anemia in pregnancy 11/25/2019  . Chlamydia infection affecting pregnancy in first trimester 07/13/2019  . Hx of preeclampsia, prior pregnancy, currently pregnant, first trimester 07/13/2019  . Subchorionic hematoma in first trimester 07/13/2019  . Supervision of other normal pregnancy, antepartum 06/23/2019  . Cannabis abuse 04/05/2014    Assessment / Plan: 24 y.o. G3P1011 at [redacted]w[redacted]d here for elective IOL   Labor: Progressing well on pitocin, continue to titrate to active labor, AROM @2324  Fetal Wellbeing:  Cat I  Pain Control:  Plans to get epidural  Anticipated MOD:   SVD  25, CNM 01/15/2020, 12:04 AM

## 2020-01-15 NOTE — Anesthesia Procedure Notes (Signed)
Epidural Patient location during procedure: OB Start time: 01/15/2020 12:00 AM End time: 01/15/2020 12:10 AM  Staffing Anesthesiologist: Elmer Picker, MD Performed: anesthesiologist   Preanesthetic Checklist Completed: patient identified, IV checked, risks and benefits discussed, monitors and equipment checked, pre-op evaluation and timeout performed  Epidural Patient position: sitting Prep: DuraPrep and site prepped and draped Patient monitoring: continuous pulse ox, blood pressure, heart rate and cardiac monitor Approach: midline Location: L3-L4 Injection technique: LOR air  Needle:  Needle type: Tuohy  Needle gauge: 17 G Needle length: 9 cm Needle insertion depth: 6 cm Catheter type: closed end flexible Catheter size: 19 Gauge Catheter at skin depth: 12 cm Test dose: negative  Assessment Sensory level: T8 Events: blood not aspirated, injection not painful, no injection resistance, no paresthesia and negative IV test  Additional Notes Patient identified. Risks/Benefits/Options discussed with patient including but not limited to bleeding, infection, nerve damage, paralysis, failed block, incomplete pain control, headache, blood pressure changes, nausea, vomiting, reactions to medication both or allergic, itching and postpartum back pain. Confirmed with bedside nurse the patient's most recent platelet count. Confirmed with patient that they are not currently taking any anticoagulation, have any bleeding history or any family history of bleeding disorders. Patient expressed understanding and wished to proceed. All questions were answered. Sterile technique was used throughout the entire procedure. Please see nursing notes for vital signs. Test dose was given through epidural catheter and negative prior to continuing to dose epidural or start infusion. Warning signs of high block given to the patient including shortness of breath, tingling/numbness in hands, complete motor block,  or any concerning symptoms with instructions to call for help. Patient was given instructions on fall risk and not to get out of bed. All questions and concerns addressed with instructions to call with any issues or inadequate analgesia.  Reason for block:procedure for pain

## 2020-01-15 NOTE — Discharge Summary (Signed)
Postpartum Discharge Summary     [name] DOB: September 06, 1995 MRN: [id number]  Date of admission: 01/14/2020 Delivery date:01/15/2020  Delivering provider: Lajean Manes  Date of discharge: 01/16/2020  Admitting diagnosis: Encounter for induction of labor [Z34.90] Intrauterine pregnancy: [redacted]w[redacted]d    Secondary diagnosis:  Active Problems:   Encounter for induction of labor   Vaginal delivery  Additional problems: history of preeclampsia     Discharge diagnosis: Term Pregnancy Delivered                                              Post partum procedures:IUD insertion Augmentation: AROM, Pitocin, Cytotec and IP Foley Complications: None  Hospital course: Induction of Labor With Vaginal Delivery   24y.o. yo G3P1011 at 322w1das admitted to the hospital 01/14/2020 for induction of labor.  Indication for induction: Elective.  Patient had an uncomplicated labor course as follows: Membrane Rupture Time/Date: 11:24 PM ,01/14/2020   Delivery Method:Vaginal, Spontaneous  Episiotomy: None  Lacerations:  None  Details of delivery can be found in separate delivery note.  Patient had a routine postpartum course. Patient is discharged home 01/16/20.  Newborn Data: Birth date:01/15/2020  Birth time:3:06 AM  Gender:Female  Living status:Living  Apgars:8 ,9  Weight:2860 g   Magnesium Sulfate received: No BMZ received: No Rhophylac:N/A MMR:N/A T-DaP:Given prenatally Flu: N/A Transfusion:No  Physical exam  Vitals:   01/15/20 1024 01/15/20 1440 01/15/20 1801 01/15/20 2209  BP: 110/67 (!) 92/57 110/68 106/69  Pulse: 77 67 80 83  Resp: '18 17 16 16  ' Temp: 98.6 F (37 C) 98.4 F (36.9 C) 98.7 F (37.1 C) 97.9 F (36.6 C)  TempSrc: Oral Oral Oral Oral  SpO2:    100%  Weight:      Height:       General: alert, cooperative and no distress Lochia: appropriate Uterine Fundus: firm Incision: N/A DVT Evaluation: No evidence of DVT seen on physical  exam. Labs: Lab Results  Component Value Date   WBC 8.1 01/14/2020   HGB 9.5 (L) 01/14/2020   HCT 30.0 (L) 01/14/2020   MCV 80.9 01/14/2020   PLT 299 01/14/2020   CMP Latest Ref Rng & Units 01/03/2020  Glucose 70 - 99 mg/dL 96  BUN 6 - 20 mg/dL <5(L)  Creatinine 0.44 - 1.00 mg/dL 0.39(L)  Sodium 135 - 145 mmol/L 136  Potassium 3.5 - 5.1 mmol/L 3.7  Chloride 98 - 111 mmol/L 107  CO2 22 - 32 mmol/L 20(L)  Calcium 8.9 - 10.3 mg/dL 8.7(L)  Total Protein 6.5 - 8.1 g/dL 6.3(L)  Total Bilirubin 0.3 - 1.2 mg/dL 0.4  Alkaline Phos 38 - 126 U/L 195(H)  AST 15 - 41 U/L 11(L)  ALT 0 - 44 U/L 11   Edinburgh Score: Edinburgh Postnatal Depression Scale Screening Tool 01/15/2020  I have been able to laugh and see the funny side of things. 0  I have looked forward with enjoyment to things. 1  I have blamed myself unnecessarily when things went wrong. 0  I have been anxious or worried for no good reason. 0  I have felt scared or panicky for no good reason. 1  Things have been getting on top of me. 1  I have been so unhappy that I have had difficulty sleeping. 0  I have felt sad or miserable.  1  I have been so unhappy that I have been crying. 0  The thought of harming myself has occurred to me. 0  Edinburgh Postnatal Depression Scale Total 4     After visit meds:  Allergies as of 01/16/2020   No Known Allergies     Medication List    STOP taking these medications   aspirin EC 81 MG tablet     TAKE these medications   acetaminophen 500 MG tablet Commonly known as: TYLENOL Take 2 tablets (1,000 mg total) by mouth every 6 (six) hours as needed for mild pain or moderate pain (for pain scale < 4).   Comfort Fit Maternity Supp Lg Misc 1 each by Does not apply route daily as needed.   cyclobenzaprine 5 MG tablet Commonly known as: FLEXERIL Take 1-2 tablets (5-10 mg total) by mouth 3 (three) times daily as needed for muscle spasms.   ferrous sulfate 325 (65 FE) MG EC tablet Take 1  tablet (325 mg total) by mouth 2 (two) times daily with a meal.   ibuprofen 600 MG tablet Commonly known as: ADVIL Take 1 tablet (600 mg total) by mouth every 6 (six) hours.   pantoprazole 20 MG tablet Commonly known as: Protonix Take 1 tablet (20 mg total) by mouth daily.   polyethylene glycol 17 g packet Commonly known as: MiraLax Take 17 g by mouth daily.   Prenatal Complete 14-0.4 MG Tabs Take 1 tablet by mouth daily.        Discharge home in stable condition Infant Feeding: Bottle and Breast Infant Disposition:home with mother Discharge instruction: per After Visit Summary and Postpartum booklet. Activity: Advance as tolerated. Pelvic rest for 6 weeks.  Diet: routine diet Future Appointments:No future appointments. Follow up Visit:   Please schedule this patient for a In person postpartum visit in 4 weeks with the following provider: Any provider. Additional Postpartum F/U:String check  Low risk pregnancy complicated by: history of PEC Delivery mode:  Vaginal, Spontaneous  Anticipated Birth Control:  PP IUD placed   07/12/3126 Arrie Senate, MD

## 2020-01-15 NOTE — Procedures (Signed)
Post-Placental IUD Insertion Procedure Note  Patient identified, informed consent signed prior to delivery, signed copy in chart, time out was performed.    Vaginal, labial and perineal areas thoroughly inspected for lacerations. No lacerations identified. Liletta IUD grasped with sterile ring forceps. Fundus identified through abdominal wall using non-insertion hand. IUD inserted to fundus with ring forceps. IUD carefully released at the fundus and ring forceps gently removed from vagina.    Strings trimmed to the level of the introitus. Patient tolerated procedure well.  Lot # [id number] Expiration Date 06/2023  Patient given post procedure instructions and IUD care card with expiration date.  Patient is asked to keep IUD strings tucked in her vagina until her postpartum follow up visit in 4-6 weeks. Patient advised to abstain from sexual intercourse and pulling on strings before her follow-up visit. Patient verbalized an understanding of the plan of care and agrees.   Sharyon Cable, CNM 01/15/20, 3:46 AM

## 2020-01-15 NOTE — Lactation Note (Signed)
This note was copied from a baby's chart. Lactation Consultation Note  [name] [id number] Date: 01/15/2020 Reason for consult: Initial assessment;Term Baby Robyn Robyn Stone now 29 hours old.  Mom reports her first baby would not latch. Mom reports he is lazy.  Just wants to hold nipple in mouth.  Infant a little fussy on arrival.  He had pooped.  Minimal assist with changing infant.  Once mom got him changed, LC assist with demo of manual pump on right breast to help nipple evert.  Nipple everted well with manual pump. LC assisted  with breastfeeding him on the right breast in cross cradle hold.  Mom let her breast go  After he was  latched  For a few minutes but he was really sleepy didn't seem to make any difference.  He fed sleepy for close to 10 minutes with some rythmic sucking and intermittent swallows. Fell asleep. Let go.  Nipple round and elongated.  Showed mom how to hand express and spoon feed past breastfeeding. Infant took 8 ml of colostrum via spoon and was content.  Put STS with mom. Mom reports will be going back to work.  Was on [facility] with first baby but not this one.  Urged mom to apply for [facility] to try and get a DEBP for returning to work. Praised breastfeeding.   Reviewed and left [facility] handout.  Urged mom to call lactation as needed.    Robyn Stone 01/15/2020, 12:48 PM

## 2020-01-16 MED ORDER — ACETAMINOPHEN 500 MG PO TABS: 1000.0000 mg | ORAL_TABLET | Freq: Four times a day (QID) | ORAL | 0 refills | Status: DC | PRN

## 2020-01-16 MED ORDER — POLYETHYLENE GLYCOL 3350 17 G PO PACK: 17.0000 g | PACK | Freq: Every day | ORAL | 0 refills | Status: DC

## 2020-01-16 MED ORDER — IBUPROFEN 600 MG PO TABS: 600.0000 mg | ORAL_TABLET | Freq: Four times a day (QID) | ORAL | 0 refills | Status: DC

## 2020-01-16 NOTE — Clinical Social Work Maternal (Signed)
CLINICAL SOCIAL WORK MATERNAL/CHILD NOTE  Patient Details  Name: AVONELLE VIVEROS MRN: [id number] Date of Birth: [birth year]  Date:  01/16/2020  Clinical Social Worker Initiating Note:  Laurey Arrow Date/Time: Initiated:  01/16/20/1538     Child's Name:  Constance Holster   Biological Parents:  Mother (MOB reported that FOB will be determined with DNA testing in the near furture. However, MOB reported that Harlon Flor is the potential Father 05/06/1994 [phone])   Need for Interpreter:  None   Reason for Referral:  Behavioral Health Concerns, Current Substance Use/Substance Use During Pregnancy  (hx of anxiety and hx of marijuana use.)   Address:  [address] [place] [address]    Phone number:  [phone] (home)     Additional phone number:   Household Members/Support Persons (HM/SP):   Household Member/Support Person 1 (MOB also reported that MOB's older reside with her.)   HM/SP Name Relationship DOB or Age  HM/SP -1 Azka Steger son 08/29/14  HM/SP -2        HM/SP -3        HM/SP -4        HM/SP -5        HM/SP -6        HM/SP -7        HM/SP -8          Natural Supports (not living in the home):  Extended Family, Immediate Family   Professional Supports: None (MOB declined resources for outpatient counseling.)   Employment: Part-time   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   Strengths:  Ability to meet basic needs , Home prepared for child , Understanding of illness, Pediatrician chosen   Psychotropic Medications:         Pediatrician:    Careers adviser area  Pediatrician List:   [facility]  ([facility]  [place]      Pediatrician Fax Number:    Risk Factors/Current Problems:  Substance Use , Mental Health Concerns , Legal Issues    Cognitive State:  Able to Concentrate , Alert , Goal Oriented , Insightful , Linear Thinking    Mood/Affect:  Interested , Comfortable , Happy , Bright , Relaxed    CSW Assessment:  CSW met with MOB in room 402 to complete an assessment for MH hx and Substance use hx. When CSW arrived, MOB was bonding with infant as evidence by engaging in skin to skin and infant massages. MOB was polite, easy to engage, and appeared to be forthcoming.   CSW inquired about MOB's mental health hx and MOB reported having anxiety symptoms however reported that she has never been Chartered loss adjuster dx with anxiety."  CSW educated MOB about PPD. CSW informed MOB of supports and interventions to decrease PPD.  CSW also encouraged MOB to seek medical attention if needed for increased signs and symptoms for PPD.  MOB reported MOB will reach out to MOB's OBGYN's office if a need arises. MOB presented with insight and awareness and she did not demonstrate any acute MH symptoms. CSW  assessed for safety and MOB denied SI, HI, and DV.    CSW asked about MOB's Substance use hx and MOB acknowledged the use of THC during her pregnancy.  Per MOB her last use of THC was early July 2021.  MOB reported she smoked marijuana to help reduce her stress. CSW reviewed the hospitals drug screen policy, and informed MOB of the 2 screenings for the infant.  CSW reported to MOB that the infants UDS was negative, and CSW will follow infant's cord screen.  MOB was made aware, that if infants cord screen is positive, CSW will make a report to CPS.  MOB did not have any questions at this time.  MOB also reported having all essential items for infant and reported feeling prepared to parent infant post discharge.   CSW Plan/Description:  No Further Intervention  Required/No Barriers to Discharge, Sudden Infant Death Syndrome (SIDS) Education, Perinatal Mood and Anxiety Disorder (PMADs) Education, Other Patient/Family Education, [place], Other Information/Referral to [employer], CSW Will Continue to Monitor Umbilical Cord Tissue Drug Screen Results and Make Report if Warranted   Laurey Arrow, MSW, LCSW Clinical Social Work [phone]  Dimple Nanas, [place] 01/16/2020, 3:46 PM

## 2020-01-16 NOTE — Lactation Note (Signed)
This note was copied from a baby's chart. Lactation Consultation Note  Patient Name: Boy Drinda Belgard [id number] Date: 01/16/2020 Reason for consult: Term  P2 mother whose infant is now 32 hours old.  This is a term baby at 39+1 weeks.  Mother attempted breast feeding with her first child(now 24 years old) but ended up pumping and bottle feeding.  Baby was in the nursery when I arrived.  Discussed breast feeding concepts with mother.  She stated that she has a strong desire to breast feed only with this baby.  Baby returned while I was visiting.  Offered to assist with latching and mother agreeable.  Mother's breasts are soft and non tender and nipples are everted and sensitive.  No breakdown noted.  Mother can easily express colostrum drops.  Assisted baby to latch in the football hold on the left breast.  Demonstrated breast compressions and gentle stimulation to keep him actively engaged.  Observed him feeding for 8 minutes prior to falling asleep.  Mother has some EBM at bedside.  Showed her how to use the cup feeder as a better choice for supplementation (she was using the spoon).  Mother is interested in trying the cup with her baby.  Discussed milk storage times.  Mother will feed back any EBM she obtains from hand expression and pumping  Using the cup feeder.  She will continue to feed on cue and supplement with her EBM.  Engorgement prevention/treatment reviewed.  Mother has a manual pump at bedside.  She does not have a DEBP for home use but will be contacting [facility] about obtaining a DEBP.  No support person present at this time.  Mother has our OP phone number for questions after discharge.  She is anxious to get home soon due to her 24 year old having "open house" for kindergarten this afternoon which she hopes to attend.  RN notified, however, she was already aware that mother anxious to be discharged.  Mother will call for any other concerns.   Maternal Data Formula Feeding for  Exclusion: Yes Reason for exclusion: Mother's choice to formula and breast feed on admission Has patient been taught Hand Expression?: Yes Does the patient have breastfeeding experience prior to this delivery?: No  Feeding Feeding Type: Breast Fed Nipple Type: Slow - flow  LATCH Score Latch: Grasps breast easily, tongue down, lips flanged, rhythmical sucking.  Audible Swallowing: A few with stimulation  Type of Nipple: Everted at rest and after stimulation  Comfort (Breast/Nipple): Filling, red/small blisters or bruises, mild/mod discomfort  Hold (Positioning): Assistance needed to correctly position infant at breast and maintain latch.  LATCH Score: 7  Interventions Interventions: Breast feeding basics reviewed;Assisted with latch;Skin to skin;Breast massage;Hand express;Breast compression;Adjust position;Expressed milk;Position options;Support pillows  Lactation Tools Discussed/Used     Consult Status Consult Status: Complete Date: 01/16/20 Follow-up type: Call as needed    Kendrew Paci R Elyn Krogh 01/16/2020, 11:12 AM

## 2020-01-16 NOTE — Anesthesia Postprocedure Evaluation (Signed)
Anesthesia Post Note  Patient: Robyn Stone  Procedure(s) Performed: AN AD HOC LABOR EPIDURAL     Patient location during evaluation: Mother Baby Anesthesia Type: Epidural Level of consciousness: awake and alert Pain management: pain level controlled Vital Signs Assessment: post-procedure vital signs reviewed and stable Respiratory status: spontaneous breathing, nonlabored ventilation and respiratory function stable Cardiovascular status: stable Postop Assessment: no headache, no backache and epidural receding Anesthetic complications: no   No complications documented.  Last Vitals:  Vitals:   01/15/20 2209 01/16/20 0605  BP: 106/69 107/73  Pulse: 83 70  Resp: 16 18  Temp: 36.6 C 36.7 C  SpO2: 100% 100%    Last Pain:  Vitals:   01/16/20 0605  TempSrc: Oral  PainSc: 4    Pain Goal:                   Edlin Ford

## 2020-01-17 ENCOUNTER — Ambulatory Visit: Payer: Self-pay

## 2020-01-17 NOTE — Lactation Note (Signed)
This note was copied from a baby's chart. Lactation Consultation Note  [name] [id number] Date: 01/17/2020   Infant is 34 hrs old. Mom's milk is coming to volume; she recently expressed about 40 mL with her manual pump.   Infant returned from hearing screen & was cueing to eat. Mom was assisted with latch (alignment & hand placement); infant had a short feeding, but swallows were verified by cervical auscultation (1:1 suck swallow ratio noted). Specifics of an asymmetric latch were shown via [employer] to give Mom a visual of the best way to latch. Infant later went to breast again while I was in room; infant latched with relative ease. Swallows were audible to the naked ear.  Mom is pleased that this infant is nursing so well (1st baby did not latch) & has no complaints.   Last stool was yellow-brown & soft-seedy. I anticipate infant will soon begin gaining weight as he only lost 25 g over the last 24 hrs and had 4 voids & 4 stools during that same time period.   Lurline Hare [facility] 01/17/2020, 7:35 AM

## 2020-01-23 ENCOUNTER — Ambulatory Visit (INDEPENDENT_AMBULATORY_CARE_PROVIDER_SITE_OTHER): Payer: Medicaid Other | Admitting: Obstetrics and Gynecology

## 2020-01-23 ENCOUNTER — Encounter: Payer: Self-pay | Admitting: Obstetrics and Gynecology

## 2020-01-23 ENCOUNTER — Other Ambulatory Visit: Payer: Self-pay

## 2020-01-23 VITALS — BP 130/73 | HR 84 | Ht 63.0 in | Wt 171.0 lb

## 2020-01-23 DIAGNOSIS — O26899 Other specified pregnancy related conditions, unspecified trimester: Secondary | ICD-10-CM

## 2020-01-23 DIAGNOSIS — F53 Postpartum depression: Secondary | ICD-10-CM

## 2020-01-23 DIAGNOSIS — F418 Other specified anxiety disorders: Secondary | ICD-10-CM

## 2020-01-23 DIAGNOSIS — O99345 Other mental disorders complicating the puerperium: Secondary | ICD-10-CM

## 2020-01-23 DIAGNOSIS — R102 Pelvic and perineal pain: Secondary | ICD-10-CM

## 2020-01-23 MED ORDER — CYCLOBENZAPRINE HCL 5 MG PO TABS: 5.0000 mg | ORAL_TABLET | Freq: Three times a day (TID) | ORAL | 0 refills | Status: DC | PRN

## 2020-01-23 MED ORDER — SERTRALINE HCL 50 MG PO TABS
50.0000 mg | ORAL_TABLET | Freq: Every day | ORAL | 1 refills | Status: DC
Start: 2020-01-23 — End: 2020-09-11

## 2020-01-23 NOTE — Progress Notes (Signed)
[place] score today is 16

## 2020-01-23 NOTE — Progress Notes (Signed)
Robyn Stone is here today for a mood check.   She is status post vaginal delivery on 8/15. She reports an increase of both anxiety and depression since delivery. States she is always worrying that something will happen to her baby. She has a hard time leaving him to do simple tasks like shower. She did not have postpartum depression or anxiety with first pregnancy. She would like to start medication to help with her symptoms.   She denies SS/ homicidal ideations. She has support at home from family. She is breast feeding.     GENERAL: Well-developed, well-nourished female in no acute distress.  LUNGS: Effort normal SKIN: Warm, dry and without erythema PSYCH: Normal mood and affect, tearful at times.   Vitals:   01/23/20 1347  Weight: 171 lb (77.6 kg)  Height: 5\' 3"  (1.6 m)   Vitals:   01/23/20 1347  BP: 130/73  Pulse: 84  Height: 5\' 3"  (1.6 m)  Weight: 171 lb (77.6 kg)  BMI (Calculated): 30.3   1. Postpartum anxiety  Rx: Zoloft, start with 25 mg and increase to 50 mg.  Follow up in Sept for PP visit.   2. Postpartum depression    Mussa Groesbeck, , NP 01/23/2020 3:42 PM

## 2020-02-14 ENCOUNTER — Ambulatory Visit: Payer: Medicaid Other | Admitting: Advanced Practice Midwife

## 2020-02-17 ENCOUNTER — Other Ambulatory Visit: Payer: Self-pay

## 2020-02-17 ENCOUNTER — Ambulatory Visit (INDEPENDENT_AMBULATORY_CARE_PROVIDER_SITE_OTHER): Payer: Medicaid Other | Admitting: Certified Nurse Midwife

## 2020-02-17 ENCOUNTER — Encounter: Payer: Self-pay | Admitting: Certified Nurse Midwife

## 2020-02-17 ENCOUNTER — Other Ambulatory Visit (HOSPITAL_COMMUNITY)
Admission: RE | Admit: 2020-02-17 | Discharge: 2020-02-17 | Disposition: A | Discharge status: Home / Self Care | Payer: Medicaid Other | Source: Ambulatory Visit | Attending: Certified Nurse Midwife | Admitting: Certified Nurse Midwife

## 2020-02-17 VITALS — BP 113/70 | HR 90 | Resp 16 | Ht 62.0 in | Wt 166.0 lb

## 2020-02-17 DIAGNOSIS — O99345 Other mental disorders complicating the puerperium: Secondary | ICD-10-CM

## 2020-02-17 DIAGNOSIS — F53 Postpartum depression: Secondary | ICD-10-CM

## 2020-02-17 DIAGNOSIS — Z8619 Personal history of other infectious and parasitic diseases: Secondary | ICD-10-CM | POA: Diagnosis not present

## 2020-02-17 NOTE — Progress Notes (Signed)
Post Partum Visit Note  Robyn Stone is a 24 y.o. [phone] female who presents for a postpartum visit. She is 4 weeks postpartum following a normal spontaneous vaginal delivery.  I have fully reviewed the prenatal and intrapartum course. The delivery was at [redacted]w[redacted]d gestational weeks.  Anesthesia: epidural. Postpartum course has been unmarkable. Baby is doing well tested positive for RSV 2 weeks ago.[name] is feeding by bottle - Similac Advance. Bleeding red, small. Bowel function is normal. Bladder function is normal. Patient is not sexually active. Contraception method is IUD. Postpartum depression screening: positive. She was started on Zoloft 25 mg 3 weeks ago. Feeling fatigued and sleepy when she takes it. Feeling more depressed than anxious. No SI/HI.    The pregnancy intention screening data noted above was reviewed. Potential methods of contraception were discussed. The patient elected to proceed with IUD or IUS.    Edinburgh Postnatal Depression Scale - 02/17/20 1019      Edinburgh Postnatal Depression Scale:  In the Past 7 Days   I have been able to laugh and see the funny side of things. 1    I have looked forward with enjoyment to things. 0    I have blamed myself unnecessarily when things went wrong. 1    I have been anxious or worried for no good reason. 2    I have felt scared or panicky for no good reason. 2    Things have been getting on top of me. 1    I have been so unhappy that I have had difficulty sleeping. 2    I have felt sad or miserable. 1    I have been so unhappy that I have been crying. 2    The thought of harming myself has occurred to me. 0    Edinburgh Postnatal Depression Scale Total 12          The following portions of the patient's history were reviewed and updated as appropriate: allergies, current medications, past family history, past medical history, past social history, past surgical history and problem list.  Review of Systems Pertinent  items are noted in HPI.    Objective:  Blood pressure 113/70, pulse 90, resp. rate 16, height 5\' 2"  (1.575 m), weight 166 lb (75.3 kg), not currently breastfeeding.  General:  alert, cooperative and no distress   Breasts:  n/a  Lungs: normal WOB  Heart:  normal rate  Abdomen: n/a   Vulva:  normal  Vagina: normal vagina and scant pink discharge  Cervix:  normal; IUD strings trimmed to 2cm  Corpus: not examined  Adnexa:  not evaluated  Rectal Exam: Not performed.        Assessment:   1. History of chlamydia   2. Postpartum depression   3. Postpartum exam     Plan:   Essential components of care per ACOG recommendations:  1.  Mood and well being: Patient with positive depression screening today. Reviewed local resources for support.  - Patient does use tobacco. If using tobacco we discussed reduction and for recently cessation risk of relapse - hx of drug use? No   If yes, discussed support systems  2. Infant care and feeding:  -Patient currently breastmilk feeding? No If breastmilk feeding discussed return to work and pumping. If needed, patient was provided letter for work to allow for every 2-3 hr pumping breaks, and to be granted a private location to express breastmilk and refrigerated area to store breastmilk. Reviewed  importance of draining breast regularly to support lactation. -Social determinants of health (SDOH) reviewed in EPIC. No concerns  3. Sexuality, contraception and birth spacing - Patient does not want a pregnancy in the next 1-2 years.  - Reviewed forms of contraception in tiered fashion. Patient desired IUD today- placed post placental.   - Discussed birth spacing of 18 months  4. Sleep and fatigue -Encouraged family/partner/community support of 4 hrs of uninterrupted sleep to help with mood and fatigue  5. Physical Recovery  - Discussed patients delivery and complications - Patient had no laceration, perineal healing reviewed. Patient expressed  understanding - Patient has urinary incontinence? No Patient was referred to pelvic floor PT  - Patient is safe to resume physical and sexual activity  6.  Health Maintenance - Last pap smear done 07/13/19 and was normal.  7. No Chronic Disease - PCP follow up  8. Hx of Chlamydia - positive 12/27/19 - treated - TOC today  9. PPD  - started on Zoloft 3 weeks ago - not improved, sedating effects - recommend longer therapy time on 25 mg and therapy - referral to [facility] placed - f/u in 3-4 weeks   Donette Larry, CNM [facility] [employer], [facility]

## 2020-02-20 LAB — CERVICOVAGINAL ANCILLARY ONLY
Chlamydia: NEGATIVE
Comment: NEGATIVE
Comment: NORMAL
Neisseria Gonorrhea: NEGATIVE

## 2020-02-27 DIAGNOSIS — J302 Other seasonal allergic rhinitis: Secondary | ICD-10-CM | POA: Diagnosis not present

## 2020-02-27 NOTE — BH Specialist Note (Addendum)
Pt did not arrive to video visit and did not answer the phone ; Left HIPPA-compliant message to call back Lavarr President from [facility] [place] [facility] at [phone] (main office) or [phone] (Jaydy Fitzhenry's office).  ; left MyChart message for patient.      

## 2020-03-02 ENCOUNTER — Ambulatory Visit: Payer: Medicaid Other | Admitting: Clinical

## 2020-03-02 ENCOUNTER — Telehealth: Payer: Self-pay | Admitting: Clinical

## 2020-03-02 DIAGNOSIS — Z91199 Patient's noncompliance with other medical treatment and regimen due to unspecified reason: Secondary | ICD-10-CM

## 2020-03-02 DIAGNOSIS — Z419 Encounter for procedure for purposes other than remedying health state, unspecified: Secondary | ICD-10-CM | POA: Diagnosis not present

## 2020-03-02 NOTE — Telephone Encounter (Signed)
Intern attempted to contact pt. utilizing [employer] [phone]) twice. The phone call was dropped both times. Intern attempted to call pt. and the call would not go through. Intern was unable to inform pt. that [employer] Asher Muir will meet the pt. at a later time today. Intern was unable to provide call back number.  Bea Graff (Supervisor: Hulda Marin)

## 2020-03-16 ENCOUNTER — Ambulatory Visit: Payer: Medicaid Other | Admitting: Certified Nurse Midwife

## 2020-04-02 DIAGNOSIS — Z419 Encounter for procedure for purposes other than remedying health state, unspecified: Secondary | ICD-10-CM | POA: Diagnosis not present

## 2020-04-09 ENCOUNTER — Ambulatory Visit (INDEPENDENT_AMBULATORY_CARE_PROVIDER_SITE_OTHER): Payer: Medicaid Other | Admitting: Obstetrics

## 2020-04-09 ENCOUNTER — Other Ambulatory Visit: Payer: Self-pay

## 2020-04-09 ENCOUNTER — Encounter: Payer: Self-pay | Admitting: Obstetrics

## 2020-04-09 VITALS — BP 115/75 | HR 91 | Wt 175.0 lb

## 2020-04-09 DIAGNOSIS — Z30432 Encounter for removal of intrauterine contraceptive device: Secondary | ICD-10-CM | POA: Diagnosis not present

## 2020-04-09 NOTE — Progress Notes (Signed)
Pt states she wants IUD removed and try for another pregnancy.

## 2020-04-09 NOTE — Progress Notes (Signed)
    GYNECOLOGY OFFICE PROCEDURE NOTE  Robyn Stone is a 24 y.o. [phone] here for Liletta IUD removal. No GYN concerns.  She states that she and her husband wants another baby.  Last pap smear was on February 2021 and was normal.  IUD Removal  Patient identified, informed consent performed, consent signed.  Patient was in the dorsal lithotomy position, normal external genitalia was noted.  A speculum was placed in the patient's vagina, normal discharge was noted, no lesions. The cervix was visualized, no lesions, no abnormal discharge.  The strings of the IUD were grasped and pulled using ring forceps. The IUD was removed in its entirety.  Patient tolerated the procedure well.    Patient will use nothing for contraception.  She has plans for pregnancy soon, and she was told to avoid teratogens, take PNV and folic acid.  Routine preventative health maintenance measures emphasized.   Brock Bad, MD, FACOG Obstetrician & Gynecologist, [facility] for [facility], [facility] 04/09/20

## 2020-05-02 DIAGNOSIS — Z419 Encounter for procedure for purposes other than remedying health state, unspecified: Secondary | ICD-10-CM | POA: Diagnosis not present

## 2020-05-13 IMAGING — US OBSTETRIC <14 WK ULTRASOUND
1 series · 14 of 28 positions shown · non-contrast
Comparison: None.

CLINICAL DATA: Right lower quadrant pain/cramping with nausea and
vomiting for 1 week.

EXAM:
OBSTETRIC <14 WK US AND TRANSVAGINAL OB US
TECHNIQUE: Both transabdominal and transvaginal ultrasound examinations were
performed for complete evaluation of the gestation as well as the
maternal uterus, adnexal regions, and pelvic cul-de-sac.
Transvaginal technique was performed to assess early pregnancy.

[Series 1: obstetric <14 wk ultrasound · 53 acquisitions, 14 frames shown]
[im 2/53]
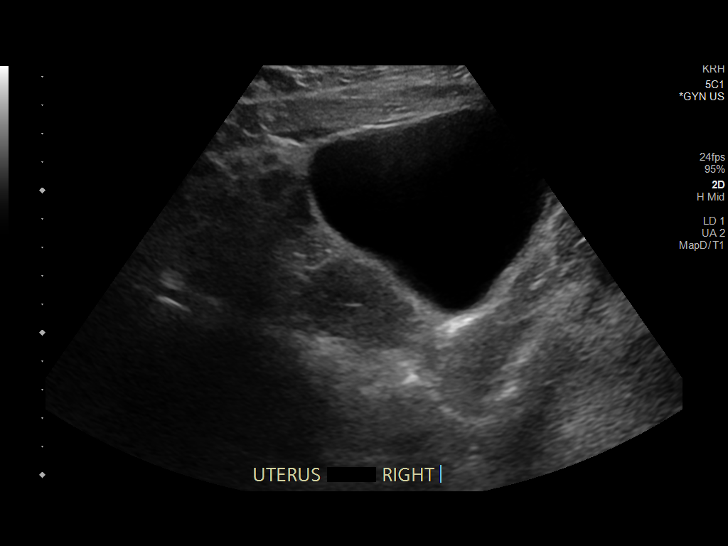
[im 6/53]
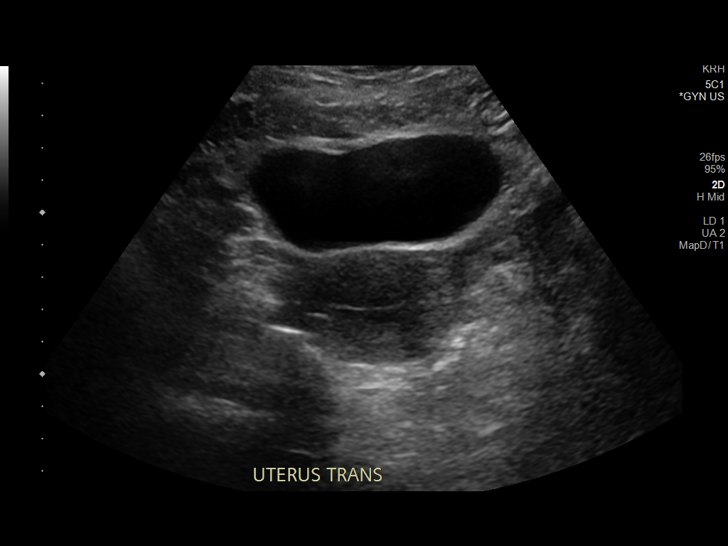
[im 10/53]
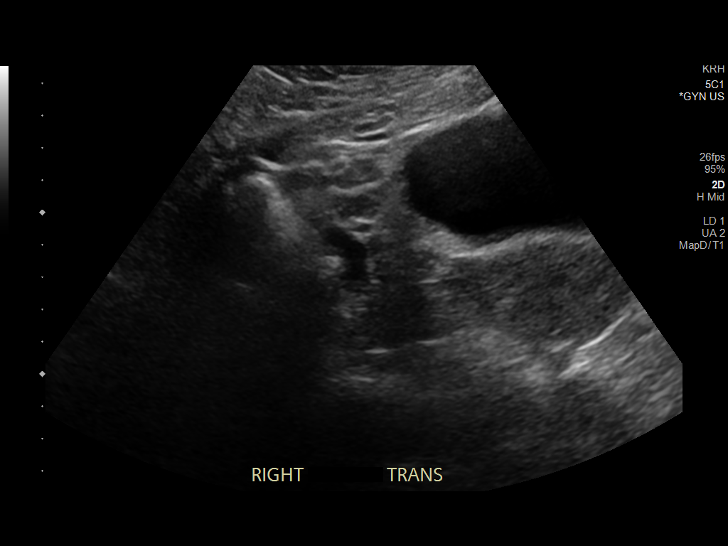
[im 14/53]
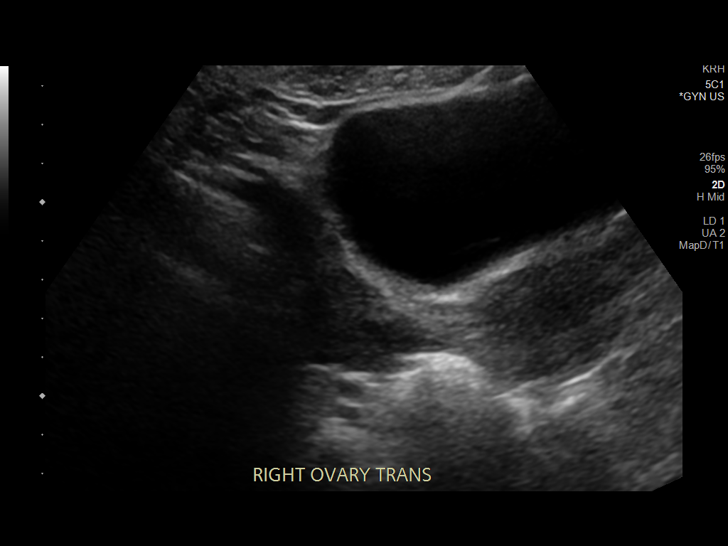
[im 18/53]
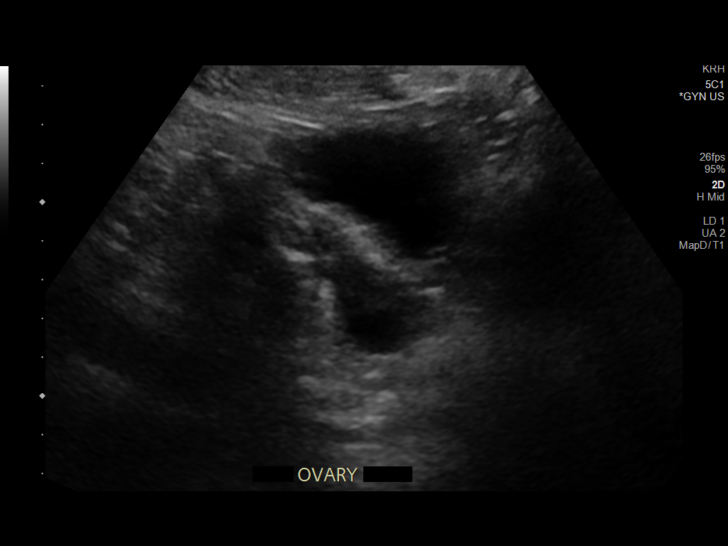
[im 22/53]
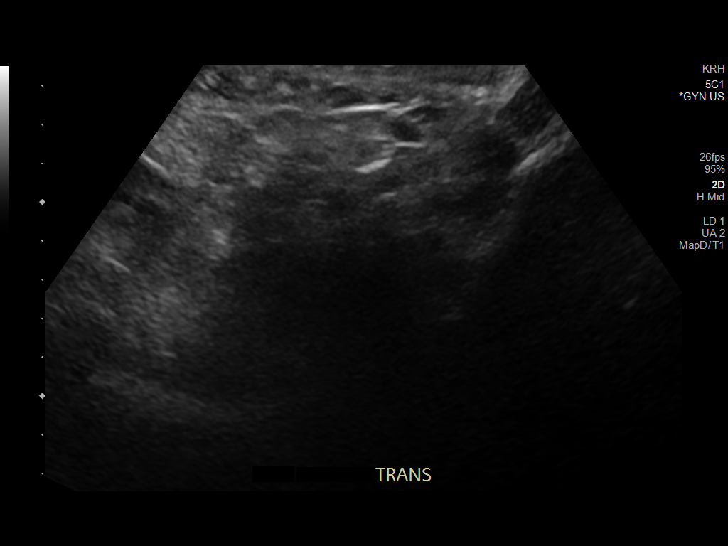
[im 26/53]
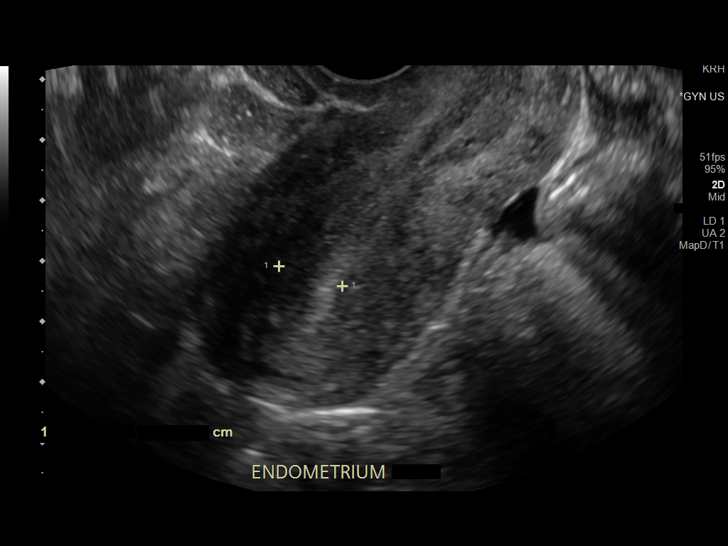
[im 29/53]
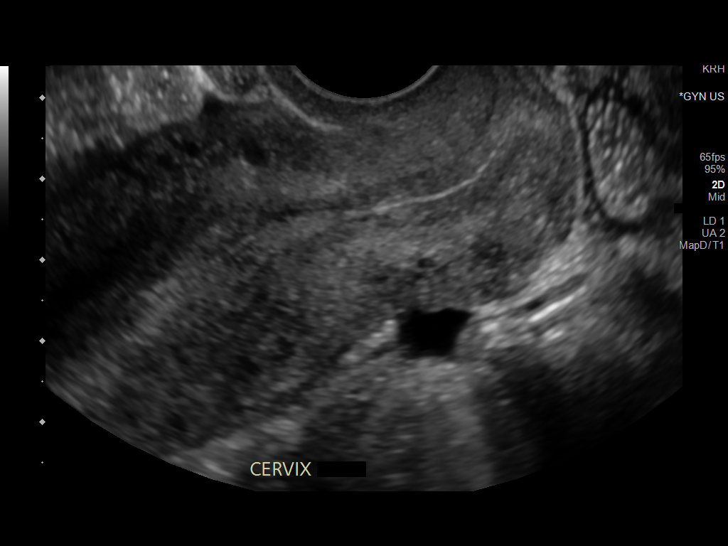
[im 33/53]
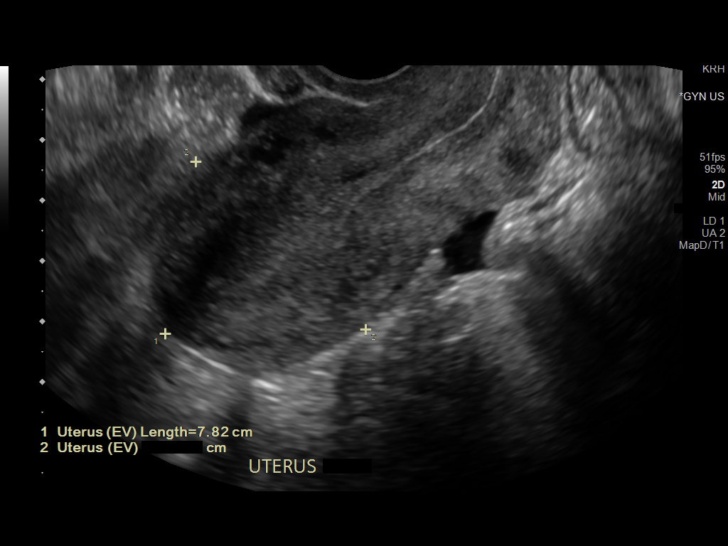
[im 37/53]
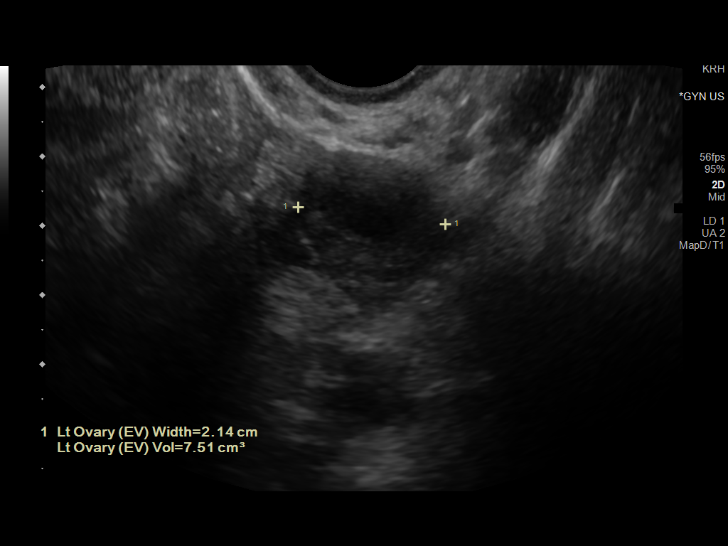
[im 41/53]
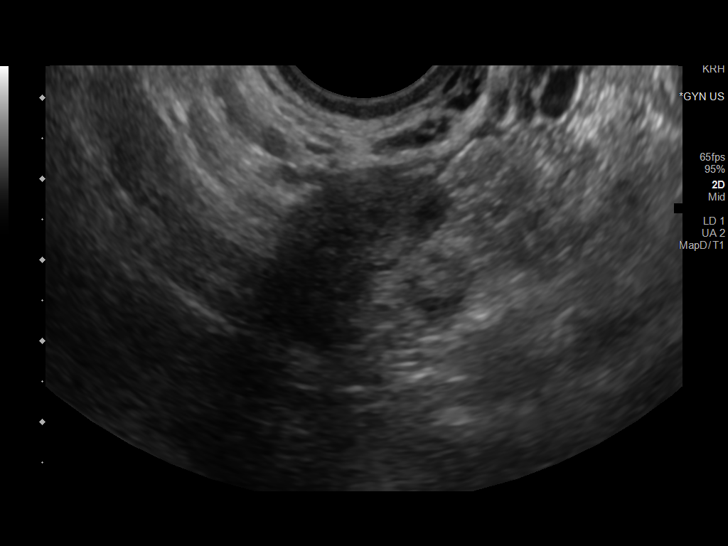
[im 45/53]
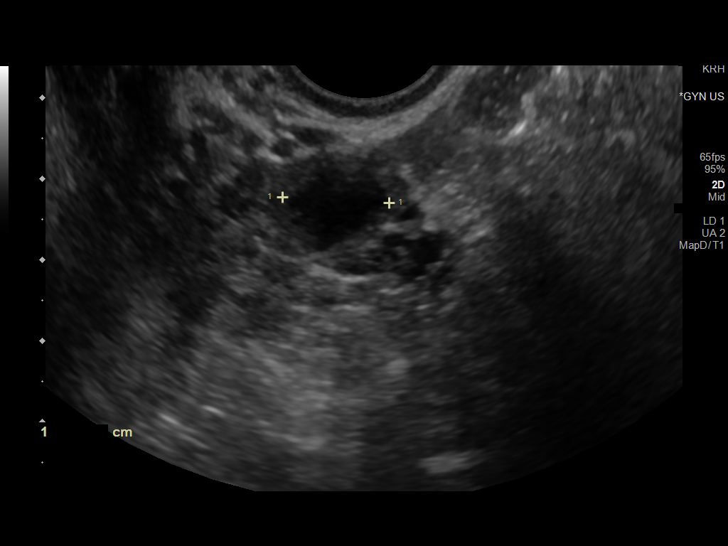
[im 49/53]
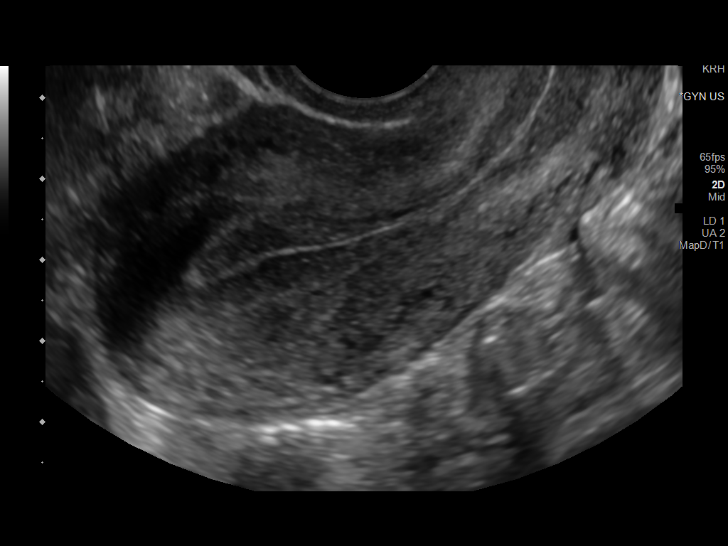
[im 53/53]
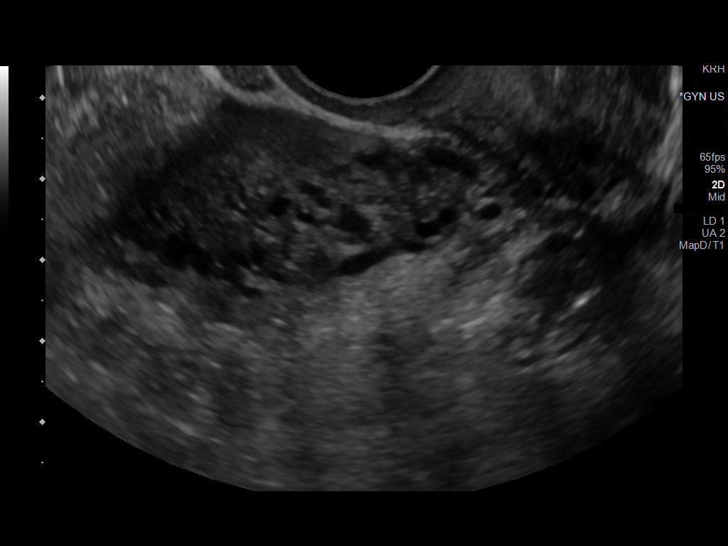

[14 of 28 positions shown; findings below may reference images not displayed]

FINDINGS: Intrauterine gestational sac: Not visualized

Maternal uterus/adnexae: Unremarkable appearance of the uterus and
ovaries. Trace pelvic free fluid. No adnexal mass.
IMPRESSION: 1. No intrauterine or ectopic pregnancy identified.
2. Unremarkable appearance of the uterus and ovaries.

## 2020-05-13 IMAGING — US ULTRASOUND ABDOMEN LIMITED
1 series · 10 of 10 positions shown · non-contrast
Comparison: None.

CLINICAL DATA: Right lower quadrant cramping for 1 week with nausea
and vomiting. Positive pregnancy test.

EXAM:
ULTRASOUND ABDOMEN LIMITED
TECHNIQUE: Gray scale imaging of the right lower quadrant was performed to
evaluate for suspected appendicitis. Standard imaging planes and
graded compression technique were utilized.

[Series 1: ultrasound abdomen limited · 10 of 10 slices shown]
[im 1/10]
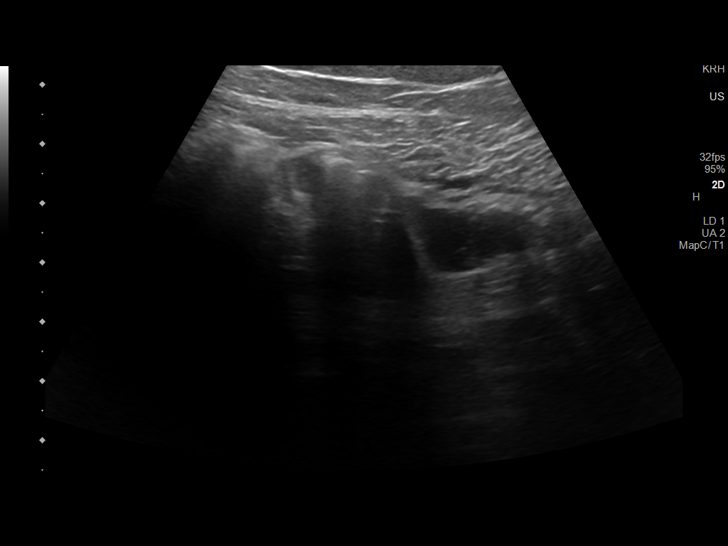
[im 2/10]
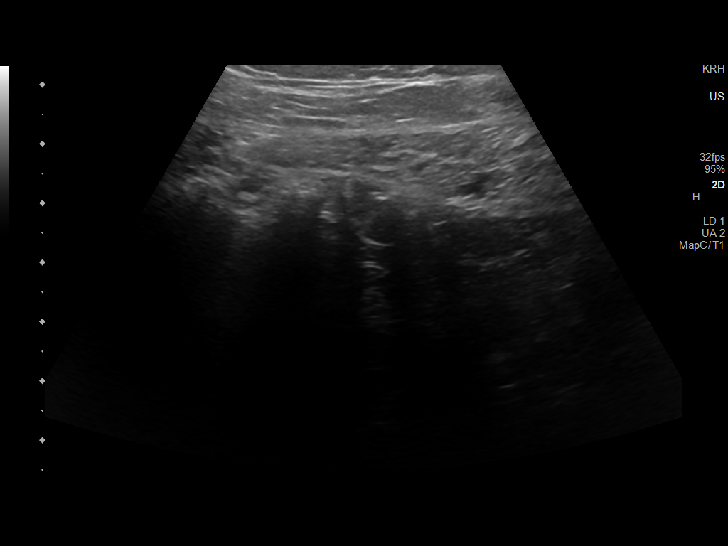
[im 3/10]
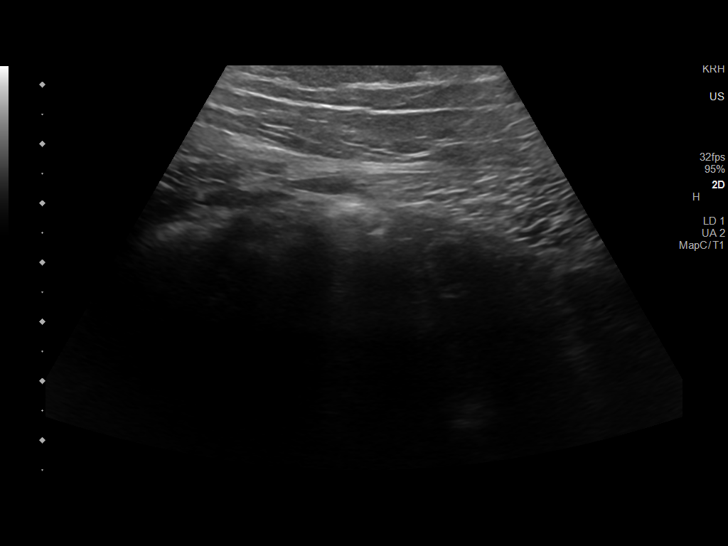
[im 4/10]
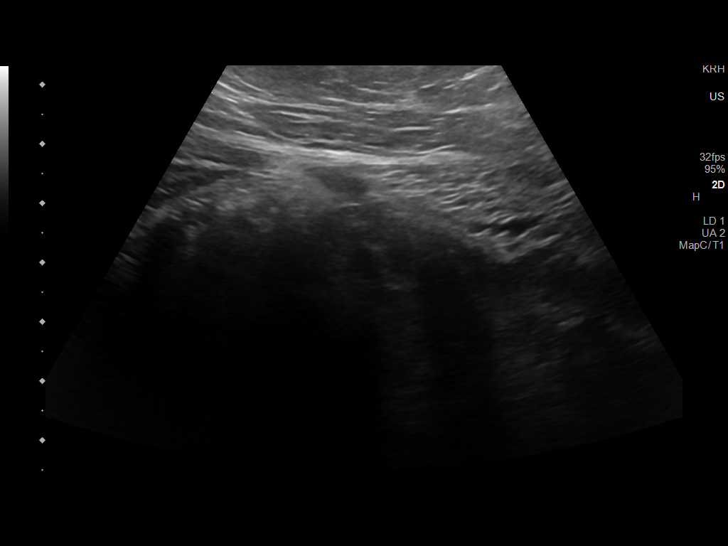
[im 5/10]
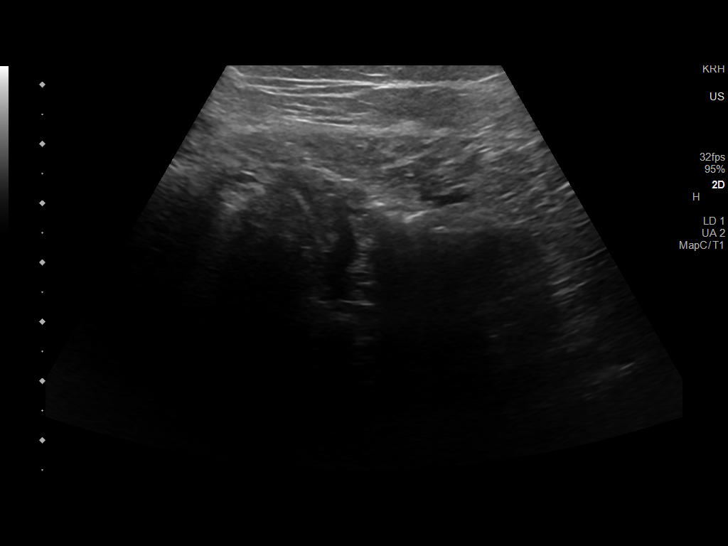
[im 6/10]
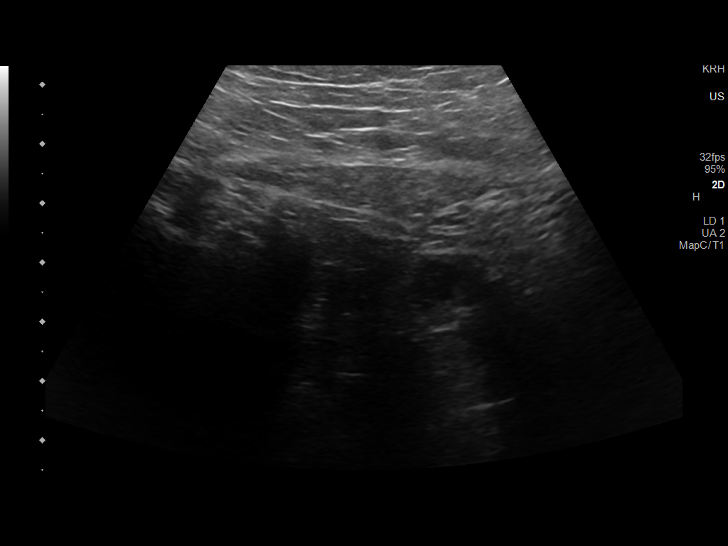
[im 7/10]
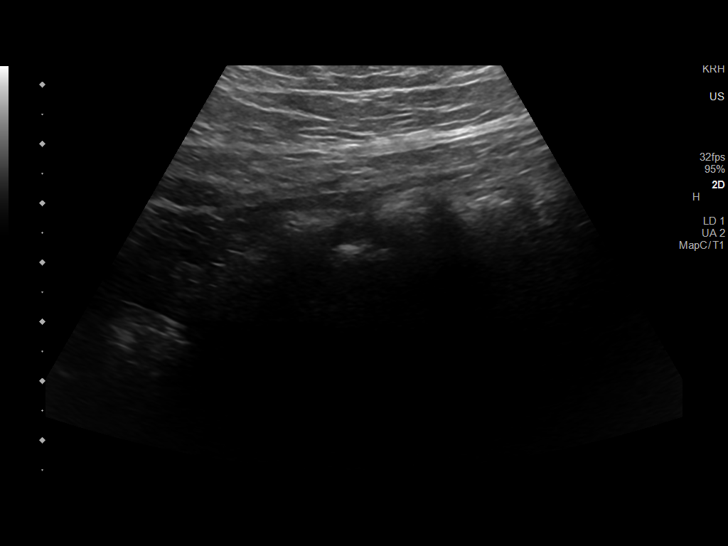
[im 8/10]
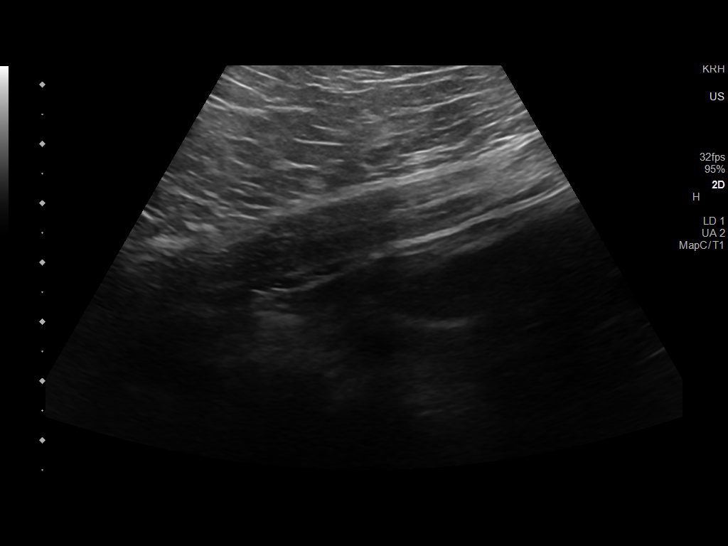
[im 9/10]
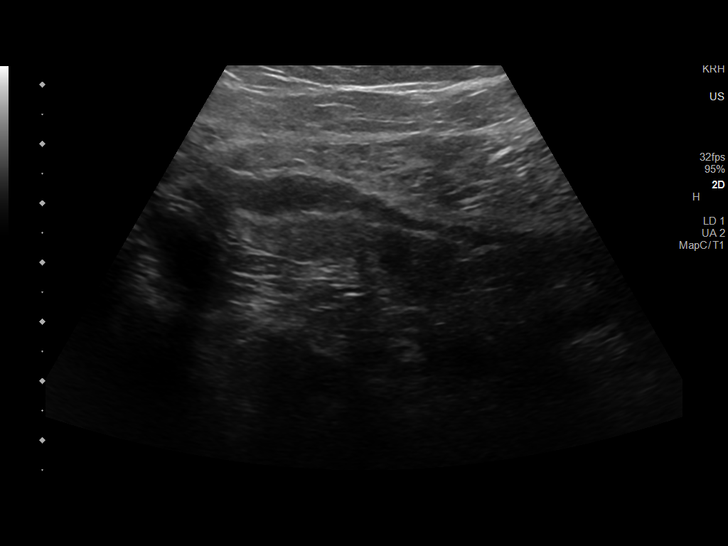
[im 10/10]
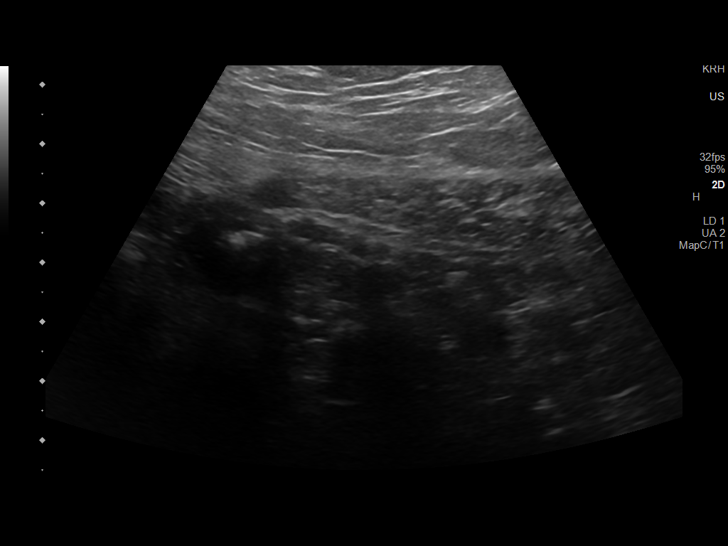

[10 of 10 positions shown; findings below may reference images not displayed]

FINDINGS: The appendix is not visualized.

Ancillary findings: None.

Factors affecting image quality: None.
IMPRESSION: Non visualization of the appendix. Non-visualization of appendix by
US does not definitely exclude appendicitis. If there is sufficient
clinical concern, consider MRI of the abdomen and pelvis for further
evaluation.

## 2020-05-18 DIAGNOSIS — Z20822 Contact with and (suspected) exposure to covid-19: Secondary | ICD-10-CM | POA: Diagnosis not present

## 2020-05-22 DIAGNOSIS — M25561 Pain in right knee: Secondary | ICD-10-CM | POA: Diagnosis not present

## 2020-05-22 DIAGNOSIS — J302 Other seasonal allergic rhinitis: Secondary | ICD-10-CM | POA: Diagnosis not present

## 2020-06-02 DIAGNOSIS — Z419 Encounter for procedure for purposes other than remedying health state, unspecified: Secondary | ICD-10-CM | POA: Diagnosis not present

## 2020-06-02 NOTE — L&D Delivery Note (Addendum)
OB/GYN Faculty Practice Delivery Note  Robyn Stone is a 25 y.o. [id number] s/p SVD at [redacted]w[redacted]d. She was admitted for term eIOL.   ROM: 0h 11m with clear fluid GBS Status: negative  Delivery Date/Time: 05/11/2021 at 1740 Delivery: Called to room and patient was feeling pressure and the urge to push. Patient checked and found to still be 8 cm. After discussion, proceeded with AROM and retrieved clear fluid. Patient was repositioned and became much more uncomfortable. She was checked again and found to be complete. She was encouraged to push. Head delivered LOA. No nuchal cord present. Shoulder and body delivered in usual fashion. Infant with spontaneous cry, placed on mother's abdomen, dried and stimulated. Cord clamped x 2 after 1-minute delay, and cut by patient's brother under my direct supervision. Cord blood drawn. Placenta delivered spontaneously with gentle cord traction. Fundus firm with massage and Pitocin. Labia, perineum, vagina, and cervix were inspected, and patient was found to have a periurethral laceration that was hemostatic and did not require repair.  Initially requested post-placental IUD, however after delivery opted to postpone with minimal epidural.   Placenta: intact, 3VC - sent to L&D Complications: none Lacerations: periurethral EBL: 50 cc Analgesia: epidural (had just received and not taken into effect quite yet)    Infant: viable female  APGARs 47, 38   Yves Dill, MD PGY1  ATTESTATION  I was present, gloved, and supervising throughout the delivery and agree with above documentation in the resident's note.  Allayne Stack, DO OB Fellow  [facility] [employer] (Faculty Practice) 05/11/2021, 8:50 PM

## 2020-06-28 ENCOUNTER — Other Ambulatory Visit: Payer: Self-pay | Admitting: Internal Medicine

## 2020-06-28 DIAGNOSIS — Z124 Encounter for screening for malignant neoplasm of cervix: Secondary | ICD-10-CM | POA: Diagnosis not present

## 2020-06-28 DIAGNOSIS — Z20822 Contact with and (suspected) exposure to covid-19: Secondary | ICD-10-CM | POA: Diagnosis not present

## 2020-06-28 DIAGNOSIS — Z131 Encounter for screening for diabetes mellitus: Secondary | ICD-10-CM | POA: Diagnosis not present

## 2020-06-28 DIAGNOSIS — M25569 Pain in unspecified knee: Secondary | ICD-10-CM | POA: Diagnosis not present

## 2020-06-28 DIAGNOSIS — E559 Vitamin D deficiency, unspecified: Secondary | ICD-10-CM | POA: Diagnosis not present

## 2020-06-28 DIAGNOSIS — Z0000 Encounter for general adult medical examination without abnormal findings: Secondary | ICD-10-CM | POA: Diagnosis not present

## 2020-06-28 DIAGNOSIS — Z13220 Encounter for screening for lipoid disorders: Secondary | ICD-10-CM | POA: Diagnosis not present

## 2020-06-28 DIAGNOSIS — Z20828 Contact with and (suspected) exposure to other viral communicable diseases: Secondary | ICD-10-CM | POA: Diagnosis not present

## 2020-06-28 DIAGNOSIS — J302 Other seasonal allergic rhinitis: Secondary | ICD-10-CM | POA: Diagnosis not present

## 2020-06-29 LAB — CBC
HCT: 34.2 % — ABNORMAL LOW (ref 35.0–45.0)
Hemoglobin: 11.6 g/dL — ABNORMAL LOW (ref 11.7–15.5)
MCH: 27.4 pg (ref 27.0–33.0)
MCHC: 33.9 g/dL (ref 32.0–36.0)
MCV: 80.9 fL (ref 80.0–100.0)
MPV: 12.1 fL (ref 7.5–12.5)
Platelets: 329 10*3/uL (ref 140–400)
RBC: 4.23 10*6/uL (ref 3.80–5.10)
RDW: 14.7 % (ref 11.0–15.0)
WBC: 8.4 10*3/uL (ref 3.8–10.8)

## 2020-06-29 LAB — SARS-COV-2 RNA,(COVID-19) QUALITATIVE NAAT: SARS CoV2 RNA: NOT DETECTED

## 2020-06-29 LAB — COMPLETE METABOLIC PANEL WITH GFR
AG Ratio: 1.6 (calc) (ref 1.0–2.5)
ALT: 14 U/L (ref 6–29)
AST: 14 U/L (ref 10–30)
Albumin: 4.4 g/dL (ref 3.6–5.1)
Alkaline phosphatase (APISO): 88 U/L (ref 31–125)
BUN: 9 mg/dL (ref 7–25)
CO2: 23 mmol/L (ref 20–32)
Calcium: 9.1 mg/dL (ref 8.6–10.2)
Chloride: 107 mmol/L (ref 98–110)
Creat: 0.5 mg/dL (ref 0.50–1.10)
GFR, Est African American: 157 mL/min/{1.73_m2} (ref >=60)
GFR, Est Non African American: 135 mL/min/{1.73_m2} (ref >=60)
Globulin: 2.7 g/dL (calc) (ref 1.9–3.7)
Glucose, Bld: 85 mg/dL (ref 65–99)
Potassium: 4.3 mmol/L (ref 3.5–5.3)
Sodium: 139 mmol/L (ref 135–146)
Total Bilirubin: 0.5 mg/dL (ref 0.2–1.2)
Total Protein: 7.1 g/dL (ref 6.1–8.1)

## 2020-06-29 LAB — LIPID PANEL
Cholesterol: 158 mg/dL (ref <200)
HDL: 38 mg/dL — ABNORMAL LOW (ref >=50)
LDL Cholesterol (Calc): 97 mg/dL (calc)
Non-HDL Cholesterol (Calc): 120 mg/dL (calc) (ref <130)
Total CHOL/HDL Ratio: 4.2 (calc) (ref <5.0)
Triglycerides: 125 mg/dL (ref <150)

## 2020-06-29 LAB — VITAMIN D 25 HYDROXY (VIT D DEFICIENCY, FRACTURES): Vit D, 25-Hydroxy: 14 ng/mL — ABNORMAL LOW (ref 30–100)

## 2020-07-03 DIAGNOSIS — Z419 Encounter for procedure for purposes other than remedying health state, unspecified: Secondary | ICD-10-CM | POA: Diagnosis not present

## 2020-07-17 ENCOUNTER — Ambulatory Visit: Payer: Medicaid Other | Admitting: Obstetrics

## 2020-07-29 DIAGNOSIS — Z20822 Contact with and (suspected) exposure to covid-19: Secondary | ICD-10-CM | POA: Diagnosis not present

## 2020-07-31 DIAGNOSIS — Z419 Encounter for procedure for purposes other than remedying health state, unspecified: Secondary | ICD-10-CM | POA: Diagnosis not present

## 2020-08-02 DIAGNOSIS — J302 Other seasonal allergic rhinitis: Secondary | ICD-10-CM | POA: Diagnosis not present

## 2020-08-02 DIAGNOSIS — O906 Postpartum mood disturbance: Secondary | ICD-10-CM | POA: Diagnosis not present

## 2020-08-24 ENCOUNTER — Ambulatory Visit: Payer: Medicaid Other | Admitting: Obstetrics

## 2020-08-31 DIAGNOSIS — Z419 Encounter for procedure for purposes other than remedying health state, unspecified: Secondary | ICD-10-CM | POA: Diagnosis not present

## 2020-09-11 ENCOUNTER — Other Ambulatory Visit: Payer: Self-pay

## 2020-09-11 ENCOUNTER — Ambulatory Visit (INDEPENDENT_AMBULATORY_CARE_PROVIDER_SITE_OTHER): Payer: Medicaid Other

## 2020-09-11 VITALS — Ht 62.0 in | Wt 167.8 lb

## 2020-09-11 DIAGNOSIS — N912 Amenorrhea, unspecified: Secondary | ICD-10-CM

## 2020-09-11 DIAGNOSIS — Z3490 Encounter for supervision of normal pregnancy, unspecified, unspecified trimester: Secondary | ICD-10-CM | POA: Insufficient documentation

## 2020-09-11 DIAGNOSIS — Z3491 Encounter for supervision of normal pregnancy, unspecified, first trimester: Secondary | ICD-10-CM | POA: Insufficient documentation

## 2020-09-11 DIAGNOSIS — Z3481 Encounter for supervision of other normal pregnancy, first trimester: Secondary | ICD-10-CM

## 2020-09-11 LAB — POCT URINE PREGNANCY: Preg Test, Ur: POSITIVE — AB

## 2020-09-11 MED ORDER — BLOOD PRESSURE KIT DEVI: 1.0000 | 0 refills | Status: DC

## 2020-09-11 MED ORDER — VITAFOL GUMMIES 3.33-0.333-34.8 MG PO CHEW: 1.0000 | CHEWABLE_TABLET | Freq: Every day | ORAL | 6 refills | Status: DC

## 2020-09-11 NOTE — Progress Notes (Signed)
PRENATAL INTAKE SUMMARY  Robyn Stone presents today New OB Nurse Interview.  OB History    Gravida  4   Para  2   Term  2   Preterm      AB  1   Living  2     SAB  1   IAB      Ectopic      Multiple  0   Live Births  2          I have reviewed the patient's medical, obstetrical, social, and family histories, medications, and available lab results.  SUBJECTIVE She has no unusual complaints  OBJECTIVE Initial Physical Exam (New OB)  GENERAL APPEARANCE: alert, well appearing   ASSESSMENT Normal pregnancy  PLAN Prenatal care to be completed at [place] [facility] labs to be completed at [facility] provider visit Baby Scripts ordered Blood Pressure kit ordered PHQ 9 score:0 GAD 7 score: 4

## 2020-09-11 NOTE — Progress Notes (Signed)
Agree with A & P. 

## 2020-09-26 ENCOUNTER — Ambulatory Visit: Payer: Medicaid Other | Admitting: Obstetrics

## 2020-09-30 DIAGNOSIS — Z419 Encounter for procedure for purposes other than remedying health state, unspecified: Secondary | ICD-10-CM | POA: Diagnosis not present

## 2020-10-08 ENCOUNTER — Ambulatory Visit (INDEPENDENT_AMBULATORY_CARE_PROVIDER_SITE_OTHER): Payer: Medicaid Other

## 2020-10-08 ENCOUNTER — Other Ambulatory Visit: Payer: Self-pay

## 2020-10-08 VITALS — BP 120/65 | HR 80 | Ht 62.0 in | Wt 166.8 lb

## 2020-10-08 DIAGNOSIS — O3680X0 Pregnancy with inconclusive fetal viability, not applicable or unspecified: Secondary | ICD-10-CM

## 2020-10-08 DIAGNOSIS — Z3481 Encounter for supervision of other normal pregnancy, first trimester: Secondary | ICD-10-CM

## 2020-10-08 DIAGNOSIS — Z3A09 9 weeks gestation of pregnancy: Secondary | ICD-10-CM

## 2020-10-08 NOTE — Progress Notes (Signed)
Patient was assessed and managed by nursing staff during this encounter. I have reviewed the chart and agree with the documentation and plan. I have also made any necessary editorial changes.  Catalina Antigua, MD 10/08/2020 11:31 AM

## 2020-10-08 NOTE — Progress Notes (Signed)
DATING AND VIABILITY SONOGRAM   Robyn Stone is a 25 y.o. year old [phone] with LMP Patient's last menstrual period was 08/04/2020. which would correlate to  [redacted]w[redacted]d weeks gestation.  She has regular menstrual cycles.   She is here today for a confirmatory initial sonogram.    GESTATION: SINGLETON Pregnancy  FETAL ACTIVITY:          Heart rate    169               The fetus is active.   GESTATIONAL AGE AND  BIOMETRICS:  Gestational criteria: Estimated Date of Delivery: 05/11/21 by LMP now at [redacted]w[redacted]d  Previous Scans:0      CROWN RUMP LENGTH           24.1 mm       9 weeks                                                                               AVERAGE EGA(BY THIS SCAN):  9 weeks  WORKING EDD( LMP ):  05/11/21     TECHNICIAN COMMENTS:  Single Live IUP at [redacted]w[redacted]d by CRL. FHR   A copy of this report including all images has been saved and backed up to a second source for retrieval if needed. All measures and details of the anatomical scan, placentation, fluid volume and pelvic anatomy are contained in that report.  Sada Mazzoni J Earl Zellmer 10/08/2020 10:26 AM

## 2020-10-15 ENCOUNTER — Encounter: Payer: Medicaid Other | Admitting: Advanced Practice Midwife

## 2020-10-23 ENCOUNTER — Other Ambulatory Visit (HOSPITAL_COMMUNITY)
Admission: RE | Admit: 2020-10-23 | Discharge: 2020-10-23 | Disposition: A | Discharge status: Home / Self Care | Payer: Medicaid Other | Source: Ambulatory Visit | Attending: Certified Nurse Midwife | Admitting: Certified Nurse Midwife

## 2020-10-23 ENCOUNTER — Other Ambulatory Visit: Payer: Self-pay

## 2020-10-23 ENCOUNTER — Ambulatory Visit (INDEPENDENT_AMBULATORY_CARE_PROVIDER_SITE_OTHER): Payer: Medicaid Other | Admitting: Certified Nurse Midwife

## 2020-10-23 VITALS — BP 107/68 | HR 81 | Wt 166.0 lb

## 2020-10-23 DIAGNOSIS — Z3A11 11 weeks gestation of pregnancy: Secondary | ICD-10-CM | POA: Diagnosis not present

## 2020-10-23 DIAGNOSIS — K219 Gastro-esophageal reflux disease without esophagitis: Secondary | ICD-10-CM

## 2020-10-23 DIAGNOSIS — O99611 Diseases of the digestive system complicating pregnancy, first trimester: Secondary | ICD-10-CM

## 2020-10-23 DIAGNOSIS — Z3481 Encounter for supervision of other normal pregnancy, first trimester: Secondary | ICD-10-CM

## 2020-10-23 MED ORDER — FAMOTIDINE 20 MG PO TABS: 20.0000 mg | ORAL_TABLET | Freq: Two times a day (BID) | ORAL | 3 refills | Status: DC

## 2020-10-23 NOTE — Patient Instructions (Signed)

## 2020-10-23 NOTE — Progress Notes (Signed)
History:   Robyn Stone is a 25 y.o. [phone] at 64w3dby LMP, early ultrasound being seen today for her first obstetrical visit.  Her obstetrical history is significant for nothing. Patient does intend to breast feed. Pregnancy history fully reviewed.  Patient reports nausea and reflux.      HISTORY: OB History  Gravida Para Term Preterm AB Living  '4 2 2 ' 0 1 2  SAB IAB Ectopic Multiple Live Births  1 0 0 0 2    # Outcome Date GA Lbr Len/2nd Weight Sex Delivery Anes PTL Lv  4 Current           3 Term 01/15/20 331w1d4:04 / 00:02 6 lb 4.9 oz (2.86 kg) M Vag-Spont EPI  LIV     Name: Spicher,BOY Jeniah     Apgar1: 8  Apgar5: 9  2 Term 08/29/14 3736w6d:00 / 00:25 5 lb 7.1 oz (2.469 kg) M Vag-Spont EPI  LIV     Apgar1: 8  Apgar5: 9  1 SAB             Last pap smear was done 07/13/19 and was normal  Past Medical History:  Diagnosis Date  . Anxiety   . Headache   . Medical history non-contributory   . Pre-eclampsia   . Pregnancy induced hypertension    Past Surgical History:  Procedure Laterality Date  . NO PAST SURGERIES     Family History  Problem Relation Age of Onset  . Hypertension Mother    Social History   Tobacco Use  . Smoking status: Former Smoker    Types: Cigarettes    Quit date: 08/01/2019    Years since quitting: 1.2  . Smokeless tobacco: Never Used  . Tobacco comment: 1/2 day  Vaping Use  . Vaping Use: Never used  Substance Use Topics  . Alcohol use: Not Currently    Alcohol/week: 0.0 standard drinks    Comment: last drink was 09/01/20  . Drug use: Yes    Types: Marijuana    Comment: daily   No Known Allergies Current Outpatient Medications on File Prior to Visit  Medication Sig Dispense Refill  . Blood Pressure Monitoring (BLOOD PRESSURE KIT) DEVI 1 kit by Does not apply route once a week. 1 each 0  . Prenatal Vit-Fe Phos-FA-Omega (VITAFOL GUMMIES) 3.33-0.333-34.8 MG CHEW Chew 1 tablet by mouth daily. 90 tablet 6   No current  facility-administered medications on file prior to visit.   Review of Systems Pertinent items noted in HPI and remainder of comprehensive ROS otherwise negative. Physical Exam:   Vitals:   10/23/20 1307  BP: 107/68  Pulse: 81  Weight: 166 lb (75.3 kg)   Fetal Heart Rate (bpm): 162  Constitutional: Well-developed, well-nourished pregnant female in no acute distress.  HEENT: PERRLA Skin: normal color and turgor, no rash Cardiovascular: normal rate & rhythm, no murmur Respiratory: normal effort, lung sounds clear throughout GI: Abd soft, non-tender, pos BS x 4, gravid appropriate for gestational age MS: Extremities nontender, no edema, normal ROM Neurologic: Alert and oriented x 4.  GU: no CVA tenderness Pelvic: NEFG, physiologic discharge, no blood, cervix clean. Pap/swabs collected  Assessment:    Pregnancy: [id number] Active Problem List   Diagnosis Date Noted  . Encounter for supervision of normal pregnancy in first trimester 09/11/2020     Plan:    1. Encounter for supervision of other normal pregnancy in first trimester Doing well other than nausea and reflux -  will prescribe med for reflux and have pt follow up if no improvement to nausea - Genetic Screening - Culture, OB Urine - Obstetric Panel, Including HIV - Cytology - PAP( [facility]) - Cervicovaginal ancillary only( [place]) - [place] MFM OB COMP + 14 WK; Future - Hepatitis C Antibody  2. [redacted] weeks gestation of pregnancy Routine new OB care including: - Initial labs drawn. - Continue prenatal vitamins. - Problem list reviewed and updated. - Genetic Screening discussed, First trimester screen, Quad screen and NIPS: ordered - Ultrasound discussed; fetal anatomic survey: ordered. - Anticipatory guidance about prenatal visits given including labs, ultrasounds, and testing. - Discussed usage of Babyscripts and virtual visits as additional source of managing and completing prenatal visits in midst of  coronavirus and pandemic.   - MyChart is active  - Pt aware of the nature of [place] (two prior pregnancies with [facility])   Routine obstetric precautions reviewed. Encouraged to seek out care at office or emergency room [facility]U preferred) for urgent and/or emergent concerns. Return in about 4 weeks (around 11/20/2020) for IN-PERSON, LOB.     Gaylan Gerold, MSN, CNM, [place] Certified Nurse Midwife, [facility]

## 2020-10-23 NOTE — Progress Notes (Signed)
New OB, reports no concerns today.

## 2020-10-24 LAB — CERVICOVAGINAL ANCILLARY ONLY
Bacterial Vaginitis (gardnerella): NEGATIVE
Candida Glabrata: NEGATIVE
Candida Vaginitis: NEGATIVE
Chlamydia: NEGATIVE
Comment: NEGATIVE
Comment: NEGATIVE
Comment: NEGATIVE
Comment: NEGATIVE
Comment: NEGATIVE
Comment: NORMAL
Neisseria Gonorrhea: NEGATIVE
Trichomonas: NEGATIVE

## 2020-10-24 LAB — OBSTETRIC PANEL, INCLUDING HIV
Antibody Screen: NEGATIVE
Basophils Absolute: 0 10*3/uL (ref 0.0–0.2)
Basos: 0 %
EOS (ABSOLUTE): 0.1 10*3/uL (ref 0.0–0.4)
Eos: 1 %
HIV Screen 4th Generation wRfx: NONREACTIVE
Hematocrit: 35.6 % (ref 34.0–46.6)
Hemoglobin: 11.5 g/dL (ref 11.1–15.9)
Hepatitis B Surface Ag: NEGATIVE
Immature Grans (Abs): 0 10*3/uL (ref 0.0–0.1)
Immature Granulocytes: 0 %
Lymphocytes Absolute: 1.7 10*3/uL (ref 0.7–3.1)
Lymphs: 24 %
MCH: 26.9 pg (ref 26.6–33.0)
MCHC: 32.3 g/dL (ref 31.5–35.7)
MCV: 83 fL (ref 79–97)
Monocytes Absolute: 0.5 10*3/uL (ref 0.1–0.9)
Monocytes: 7 %
Neutrophils Absolute: 4.7 10*3/uL (ref 1.4–7.0)
Neutrophils: 68 %
Platelets: 238 10*3/uL (ref 150–450)
RBC: 4.27 x10E6/uL (ref 3.77–5.28)
RDW: 15.9 % — ABNORMAL HIGH (ref 11.7–15.4)
RPR Ser Ql: NONREACTIVE
Rh Factor: POSITIVE
Rubella Antibodies, IGG: <0.9 index — ABNORMAL LOW (ref >0.99)
WBC: 7 10*3/uL (ref 3.4–10.8)

## 2020-10-24 LAB — HEPATITIS C ANTIBODY: Hep C Virus Ab: <0.1 s/co ratio (ref 0.0–0.9)

## 2020-10-26 LAB — URINE CULTURE, OB REFLEX

## 2020-10-26 LAB — CULTURE, OB URINE

## 2020-10-31 ENCOUNTER — Encounter: Payer: Self-pay | Admitting: Obstetrics and Gynecology

## 2020-10-31 ENCOUNTER — Other Ambulatory Visit: Payer: Self-pay

## 2020-10-31 ENCOUNTER — Encounter: Payer: Self-pay | Admitting: Certified Nurse Midwife

## 2020-10-31 DIAGNOSIS — Z419 Encounter for procedure for purposes other than remedying health state, unspecified: Secondary | ICD-10-CM | POA: Diagnosis not present

## 2020-10-31 DIAGNOSIS — Z2839 Other underimmunization status: Secondary | ICD-10-CM | POA: Insufficient documentation

## 2020-10-31 DIAGNOSIS — O09899 Supervision of other high risk pregnancies, unspecified trimester: Secondary | ICD-10-CM | POA: Insufficient documentation

## 2020-10-31 MED ORDER — BLOOD PRESSURE MONITOR 3 DEVI: 0 refills | Status: DC

## 2020-10-31 NOTE — Telephone Encounter (Signed)
Patient lost her blood pressure meter and would like another device called into her pharmacy.

## 2020-11-01 LAB — CYTOLOGY - PAP
Comment: NEGATIVE
Comment: NEGATIVE
Diagnosis: NEGATIVE
HPV 16: POSITIVE — AB
HPV 18 / 45: NEGATIVE
High risk HPV: POSITIVE — AB

## 2020-11-05 ENCOUNTER — Encounter: Payer: Medicaid Other | Admitting: Obstetrics

## 2020-11-12 ENCOUNTER — Encounter: Payer: Self-pay | Admitting: Obstetrics and Gynecology

## 2020-11-20 ENCOUNTER — Encounter: Payer: Self-pay | Admitting: Obstetrics

## 2020-11-20 ENCOUNTER — Other Ambulatory Visit: Payer: Self-pay

## 2020-11-20 ENCOUNTER — Ambulatory Visit (INDEPENDENT_AMBULATORY_CARE_PROVIDER_SITE_OTHER): Payer: Medicaid Other | Admitting: Obstetrics

## 2020-11-20 VITALS — BP 106/71 | HR 75 | Wt 160.0 lb

## 2020-11-20 DIAGNOSIS — Z3481 Encounter for supervision of other normal pregnancy, first trimester: Secondary | ICD-10-CM

## 2020-11-20 DIAGNOSIS — O99611 Diseases of the digestive system complicating pregnancy, first trimester: Secondary | ICD-10-CM

## 2020-11-20 DIAGNOSIS — K219 Gastro-esophageal reflux disease without esophagitis: Secondary | ICD-10-CM

## 2020-11-20 MED ORDER — FAMOTIDINE 20 MG PO TABS: 20.0000 mg | ORAL_TABLET | Freq: Two times a day (BID) | ORAL | 3 refills | Status: DC

## 2020-11-20 MED ORDER — VITAFOL GUMMIES 3.33-0.333-34.8 MG PO CHEW: 1.0000 | CHEWABLE_TABLET | Freq: Every day | ORAL | 6 refills | Status: DC

## 2020-11-20 NOTE — Progress Notes (Signed)
Subjective:  Robyn Stone is a 25 y.o. [phone] at [redacted]w[redacted]d being seen today for ongoing prenatal care.  She is currently monitored for the following issues for this low-risk pregnancy and has Encounter for supervision of normal pregnancy in first trimester and Rubella non-immune status, antepartum on their problem list.  Patient reports heartburn.  Contractions: Not present. Vag. Bleeding: None.  Movement: Present. Denies leaking of fluid.   The following portions of the patient's history were reviewed and updated as appropriate: allergies, current medications, past family history, past medical history, past social history, past surgical history and problem list. Problem list updated.  Objective:   Vitals:   11/20/20 1041  BP: 106/71  Pulse: 75  Weight: 160 lb (72.6 kg)    Fetal Status:     Movement: Present     General:  Alert, oriented and cooperative. Patient is in no acute distress.  Skin: Skin is warm and dry. No rash noted.   Cardiovascular: Normal heart rate noted  Respiratory: Normal respiratory effort, no problems with respiration noted  Abdomen: Soft, gravid, appropriate for gestational age. Pain/Pressure: Absent     Pelvic:  Cervical exam deferred        Extremities: Normal range of motion.  Edema: None  Mental Status: Normal mood and affect. Normal behavior. Normal judgment and thought content.   Urinalysis:      Assessment and Plan:  Pregnancy: [id number] at [redacted]w[redacted]d  1. Encounter for supervision of other normal pregnancy in first trimester Rx: - AFP, Serum, Open Spina Bifida - Prenatal Vit-Fe Phos-FA-Omega (VITAFOL GUMMIES) 3.33-0.333-34.8 MG CHEW; Chew 1 tablet by mouth daily.  Dispense: 90 tablet; Refill: 6  2. Gastroesophageal reflux during pregnancy in first trimester, antepartum Rx: - famotidine (PEPCID) 20 MG tablet; Take 1 tablet (20 mg total) by mouth 2 (two) times daily.  Dispense: 60 tablet; Refill: 3   Preterm labor symptoms and general obstetric  precautions including but not limited to vaginal bleeding, contractions, leaking of fluid and fetal movement were reviewed in detail with the patient. Please refer to After Visit Summary for other counseling recommendations.   Return in about 4 weeks (around 12/18/2020) for ROB.   Brock Bad, MD  11/20/20

## 2020-11-20 NOTE — Progress Notes (Signed)
ROB [redacted]w[redacted]d  CC: None.  Needs to discuss pap and NOB labs

## 2020-11-22 LAB — AFP, SERUM, OPEN SPINA BIFIDA
AFP MoM: 0.85
AFP Value: 24.7 ng/mL
Gest. Age on Collection Date: 15 weeks
Maternal Age At EDD: 25.6 yr
OSBR Risk 1 IN: 10000
Test Results:: NEGATIVE
Weight: 160 [lb_av]

## 2020-11-30 DIAGNOSIS — Z419 Encounter for procedure for purposes other than remedying health state, unspecified: Secondary | ICD-10-CM | POA: Diagnosis not present

## 2020-12-17 ENCOUNTER — Other Ambulatory Visit: Payer: Self-pay | Admitting: *Deleted

## 2020-12-17 ENCOUNTER — Other Ambulatory Visit: Payer: Self-pay

## 2020-12-17 ENCOUNTER — Ambulatory Visit: Payer: Medicaid Other | Attending: Certified Nurse Midwife

## 2020-12-17 DIAGNOSIS — Z363 Encounter for antenatal screening for malformations: Secondary | ICD-10-CM | POA: Insufficient documentation

## 2020-12-17 DIAGNOSIS — Z3A19 19 weeks gestation of pregnancy: Secondary | ICD-10-CM | POA: Insufficient documentation

## 2020-12-17 DIAGNOSIS — O09292 Supervision of pregnancy with other poor reproductive or obstetric history, second trimester: Secondary | ICD-10-CM | POA: Diagnosis not present

## 2020-12-17 DIAGNOSIS — Z3481 Encounter for supervision of other normal pregnancy, first trimester: Secondary | ICD-10-CM | POA: Diagnosis not present

## 2020-12-17 DIAGNOSIS — O99212 Obesity complicating pregnancy, second trimester: Secondary | ICD-10-CM | POA: Insufficient documentation

## 2020-12-17 DIAGNOSIS — Z6831 Body mass index (BMI) 31.0-31.9, adult: Secondary | ICD-10-CM

## 2020-12-18 ENCOUNTER — Encounter: Payer: Medicaid Other | Admitting: Obstetrics

## 2020-12-26 ENCOUNTER — Ambulatory Visit (INDEPENDENT_AMBULATORY_CARE_PROVIDER_SITE_OTHER): Payer: Medicaid Other | Admitting: Obstetrics

## 2020-12-26 ENCOUNTER — Other Ambulatory Visit: Payer: Self-pay

## 2020-12-26 ENCOUNTER — Encounter: Payer: Self-pay | Admitting: Obstetrics

## 2020-12-26 ENCOUNTER — Other Ambulatory Visit (HOSPITAL_COMMUNITY)
Admission: RE | Admit: 2020-12-26 | Discharge: 2020-12-26 | Disposition: A | Discharge status: Home / Self Care | Payer: Medicaid Other | Source: Ambulatory Visit | Attending: Obstetrics | Admitting: Obstetrics

## 2020-12-26 VITALS — BP 110/68 | HR 91 | Wt 162.0 lb

## 2020-12-26 DIAGNOSIS — Z3480 Encounter for supervision of other normal pregnancy, unspecified trimester: Secondary | ICD-10-CM | POA: Diagnosis not present

## 2020-12-26 DIAGNOSIS — R300 Dysuria: Secondary | ICD-10-CM

## 2020-12-26 LAB — POCT URINALYSIS DIPSTICK
Bilirubin, UA: NEGATIVE
Blood, UA: NEGATIVE
Glucose, UA: NEGATIVE
Ketones, UA: NEGATIVE
Leukocytes, UA: NEGATIVE
Nitrite, UA: NEGATIVE
Odor: NEGATIVE
Protein, UA: NEGATIVE
Spec Grav, UA: 1.01 (ref 1.010–1.025)
Urobilinogen, UA: 0.2 E.U./dL
pH, UA: 7 (ref 5.0–8.0)

## 2020-12-26 NOTE — Addendum Note (Signed)
Addended by: Dalphine Handing on: 12/26/2020 05:07 PM   Modules accepted: Orders

## 2020-12-26 NOTE — Progress Notes (Addendum)
Pt presents for ROB requests all STD testing.  Pt forgot to get STD blood work. She will request again during next visit.

## 2020-12-26 NOTE — Progress Notes (Signed)
Subjective:  Robyn Stone is a 25 y.o. [phone] at [redacted]w[redacted]d being seen today for ongoing prenatal care.  She is currently monitored for the following issues for this low-risk pregnancy and has Encounter for supervision of normal pregnancy in first trimester and Rubella non-immune status, antepartum on their problem list.  Patient reports heartburn.  Contractions: Not present. Vag. Bleeding: None.  Movement: Present. Denies leaking of fluid.   The following portions of the patient's history were reviewed and updated as appropriate: allergies, current medications, past family history, past medical history, past social history, past surgical history and problem list. Problem list updated.  Objective:   Vitals:   12/26/20 1617  BP: 110/68  Pulse: 91  Weight: 162 lb (73.5 kg)    Fetal Status: Fetal Heart Rate (bpm): 160   Movement: Present     General:  Alert, oriented and cooperative. Patient is in no acute distress.  Skin: Skin is warm and dry. No rash noted.   Cardiovascular: Normal heart rate noted  Respiratory: Normal respiratory effort, no problems with respiration noted  Abdomen: Soft, gravid, appropriate for gestational age. Pain/Pressure: Absent     Pelvic:  Cervical exam deferred        Extremities: Normal range of motion.  Edema: None  Mental Status: Normal mood and affect. Normal behavior. Normal judgment and thought content.   Urinalysis:      Assessment and Plan:  Pregnancy: [id number] at [redacted]w[redacted]d  1. Supervision of other normal pregnancy, antepartum Rx: - Cervicovaginal ancillary only( [place]) - POCT Urinalysis Dipstick - Hepatitis B surface antigen - Hepatitis C antibody - HIV Antibody (routine testing w rflx) - RPR  2. Dysuria   Preterm labor symptoms and general obstetric precautions including but not limited to vaginal bleeding, contractions, leaking of fluid and fetal movement were reviewed in detail with the patient. Please refer to After Visit Summary  for other counseling recommendations.   Return in about 4 weeks (around 01/23/2021) for ROB.   Brock Bad, MD  12/26/20

## 2020-12-28 LAB — CERVICOVAGINAL ANCILLARY ONLY
Bacterial Vaginitis (gardnerella): NEGATIVE
Candida Glabrata: NEGATIVE
Candida Vaginitis: POSITIVE — AB
Chlamydia: POSITIVE — AB
Comment: NEGATIVE
Comment: NEGATIVE
Comment: NEGATIVE
Comment: NEGATIVE
Comment: NEGATIVE
Comment: NORMAL
Neisseria Gonorrhea: NEGATIVE
Trichomonas: NEGATIVE

## 2020-12-31 ENCOUNTER — Telehealth: Payer: Self-pay

## 2020-12-31 ENCOUNTER — Other Ambulatory Visit: Payer: Self-pay | Admitting: Obstetrics

## 2020-12-31 DIAGNOSIS — B9689 Other specified bacterial agents as the cause of diseases classified elsewhere: Secondary | ICD-10-CM

## 2020-12-31 DIAGNOSIS — N760 Acute vaginitis: Secondary | ICD-10-CM

## 2020-12-31 DIAGNOSIS — Z419 Encounter for procedure for purposes other than remedying health state, unspecified: Secondary | ICD-10-CM | POA: Diagnosis not present

## 2020-12-31 MED ORDER — METRONIDAZOLE 500 MG PO TABS: 500.0000 mg | ORAL_TABLET | Freq: Two times a day (BID) | ORAL | 2 refills | Status: DC

## 2020-12-31 NOTE — Telephone Encounter (Signed)
-----   Message from Brock Bad, MD sent at 12/31/2020  8:27 AM EDT ----- Flagyl Rx for Trichomonas and BV

## 2020-12-31 NOTE — Telephone Encounter (Signed)
Patient reviewed test results online. 

## 2021-01-01 ENCOUNTER — Telehealth: Payer: Self-pay

## 2021-01-01 MED ORDER — AZITHROMYCIN 500 MG PO TABS
ORAL_TABLET | ORAL | 0 refills | Status: DC
Start: 2021-01-01 — End: 2021-01-02

## 2021-01-01 MED ORDER — TERCONAZOLE 0.4 % VA CREA: 1.0000 | TOPICAL_CREAM | Freq: Every day | VAGINAL | 0 refills | Status: DC

## 2021-01-01 NOTE — Telephone Encounter (Signed)
Medication has been called into patient pharmacy to help clear up her infection.

## 2021-01-02 ENCOUNTER — Other Ambulatory Visit: Payer: Self-pay

## 2021-01-02 MED ORDER — AZITHROMYCIN 500 MG PO TABS: ORAL_TABLET | ORAL | 0 refills | Status: DC

## 2021-01-15 ENCOUNTER — Ambulatory Visit: Payer: Medicaid Other | Admitting: *Deleted

## 2021-01-15 ENCOUNTER — Encounter: Payer: Self-pay | Admitting: *Deleted

## 2021-01-15 ENCOUNTER — Ambulatory Visit: Payer: Medicaid Other | Attending: Obstetrics and Gynecology

## 2021-01-15 ENCOUNTER — Other Ambulatory Visit: Payer: Self-pay

## 2021-01-15 VITALS — BP 106/64 | HR 80

## 2021-01-15 DIAGNOSIS — F1290 Cannabis use, unspecified, uncomplicated: Secondary | ICD-10-CM | POA: Diagnosis not present

## 2021-01-15 DIAGNOSIS — O09292 Supervision of pregnancy with other poor reproductive or obstetric history, second trimester: Secondary | ICD-10-CM

## 2021-01-15 DIAGNOSIS — O99322 Drug use complicating pregnancy, second trimester: Secondary | ICD-10-CM | POA: Diagnosis not present

## 2021-01-15 DIAGNOSIS — Z6831 Body mass index (BMI) 31.0-31.9, adult: Secondary | ICD-10-CM

## 2021-01-15 DIAGNOSIS — Z3A23 23 weeks gestation of pregnancy: Secondary | ICD-10-CM

## 2021-01-15 DIAGNOSIS — E669 Obesity, unspecified: Secondary | ICD-10-CM | POA: Diagnosis not present

## 2021-01-15 DIAGNOSIS — Z3481 Encounter for supervision of other normal pregnancy, first trimester: Secondary | ICD-10-CM | POA: Insufficient documentation

## 2021-01-15 DIAGNOSIS — O99212 Obesity complicating pregnancy, second trimester: Secondary | ICD-10-CM

## 2021-01-15 DIAGNOSIS — Z362 Encounter for other antenatal screening follow-up: Secondary | ICD-10-CM | POA: Diagnosis not present

## 2021-01-23 ENCOUNTER — Encounter: Payer: Medicaid Other | Admitting: Obstetrics

## 2021-01-28 ENCOUNTER — Encounter: Payer: Medicaid Other | Admitting: Obstetrics and Gynecology

## 2021-01-31 DIAGNOSIS — Z419 Encounter for procedure for purposes other than remedying health state, unspecified: Secondary | ICD-10-CM | POA: Diagnosis not present

## 2021-02-07 ENCOUNTER — Encounter (HOSPITAL_COMMUNITY): Payer: Self-pay | Admitting: Obstetrics and Gynecology

## 2021-02-07 ENCOUNTER — Encounter: Payer: Medicaid Other | Admitting: Obstetrics

## 2021-02-07 ENCOUNTER — Other Ambulatory Visit: Payer: Self-pay

## 2021-02-07 ENCOUNTER — Inpatient Hospital Stay (HOSPITAL_COMMUNITY)
Admission: AD | Admit: 2021-02-07 | Discharge: 2021-02-07 | Disposition: A | Discharge status: Home / Self Care | Payer: Medicaid Other | Attending: Obstetrics and Gynecology | Admitting: Obstetrics and Gynecology

## 2021-02-07 DIAGNOSIS — O99891 Other specified diseases and conditions complicating pregnancy: Secondary | ICD-10-CM

## 2021-02-07 DIAGNOSIS — Y929 Unspecified place or not applicable: Secondary | ICD-10-CM | POA: Insufficient documentation

## 2021-02-07 DIAGNOSIS — Y939 Activity, unspecified: Secondary | ICD-10-CM | POA: Insufficient documentation

## 2021-02-07 DIAGNOSIS — Z3490 Encounter for supervision of normal pregnancy, unspecified, unspecified trimester: Secondary | ICD-10-CM

## 2021-02-07 DIAGNOSIS — W010XXA Fall on same level from slipping, tripping and stumbling without subsequent striking against object, initial encounter: Secondary | ICD-10-CM | POA: Diagnosis not present

## 2021-02-07 DIAGNOSIS — Z87891 Personal history of nicotine dependence: Secondary | ICD-10-CM | POA: Insufficient documentation

## 2021-02-07 DIAGNOSIS — O09292 Supervision of pregnancy with other poor reproductive or obstetric history, second trimester: Secondary | ICD-10-CM | POA: Diagnosis not present

## 2021-02-07 DIAGNOSIS — O9A219 Injury, poisoning and certain other consequences of external causes complicating pregnancy, unspecified trimester: Secondary | ICD-10-CM

## 2021-02-07 DIAGNOSIS — Z79899 Other long term (current) drug therapy: Secondary | ICD-10-CM | POA: Diagnosis not present

## 2021-02-07 DIAGNOSIS — W19XXXA Unspecified fall, initial encounter: Secondary | ICD-10-CM | POA: Insufficient documentation

## 2021-02-07 DIAGNOSIS — Z3A26 26 weeks gestation of pregnancy: Secondary | ICD-10-CM

## 2021-02-07 DIAGNOSIS — Z043 Encounter for examination and observation following other accident: Secondary | ICD-10-CM | POA: Insufficient documentation

## 2021-02-07 NOTE — MAU Provider Note (Signed)
History     [id number]  Arrival date and time: 02/07/21 1612    Chief Complaint  Patient presents with   Fall     HPI Robyn Stone is a 25 y.o. at [redacted]w[redacted]d who presents for evaluation after a fall.    Patient reports she went to pick up her children from daycare While walking out it was raining and the stairs were slick, she fell on her left back side This occurred at about 1445 She did not hit her abdomen, only her lower left back She denies feeling any contractions No vaginal bleeding Fetal movement is normal No loss of fluid   A/Positive/-- (05/24 1357)  OB History     Gravida  4   Para  2   Term  2   Preterm      AB  1   Living  2      SAB  1   IAB      Ectopic      Multiple  0   Live Births  2           Past Medical History:  Diagnosis Date   Anxiety    Headache    Medical history non-contributory    Pre-eclampsia    Pregnancy induced hypertension     Past Surgical History:  Procedure Laterality Date   NO PAST SURGERIES      Family History  Problem Relation Age of Onset   Hypertension Mother    Stroke Father     Social History   Socioeconomic History   Marital status: Single    Spouse name: Not on file   Number of children: Not on file   Years of education: Not on file   Highest education level: Not on file  Occupational History   Not on file  Tobacco Use   Smoking status: Former    Types: Cigarettes    Quit date: 08/01/2019    Years since quitting: 1.5   Smokeless tobacco: Never   Tobacco comments:    1/2 day  Vaping Use   Vaping Use: Never used  Substance and Sexual Activity   Alcohol use: Not Currently    Alcohol/week: 0.0 standard drinks    Comment: last drink was 09/01/20   Drug use: Yes    Types: Marijuana    Comment: daily   Sexual activity: Not Currently    Partners: Male    Birth control/protection: None  Other Topics Concern   Not on file  Social History Narrative   Not on file   Social  Determinants of Health   Financial Resource Strain: Not on file  Food Insecurity: Not on file  Transportation Needs: Not on file  Physical Activity: Not on file  Stress: Not on file  Social Connections: Not on file  Intimate Partner Violence: Not on file    No Known Allergies  No current facility-administered medications on file prior to encounter.   Current Outpatient Medications on File Prior to Encounter  Medication Sig Dispense Refill   Prenatal Vit-Fe Phos-FA-Omega (VITAFOL GUMMIES) 3.33-0.333-34.8 MG CHEW Chew 1 tablet by mouth daily. 90 tablet 6   azithromycin (ZITHROMAX) 500 MG tablet Take two tablet at the same time. Please eat with this medication. 2 tablet 0   Blood Pressure Monitoring (BLOOD PRESSURE KIT) DEVI 1 kit by Does not apply route once a week. 1 each 0   Blood Pressure Monitoring (BLOOD PRESSURE MONITOR 3) DEVI Please check blood pressure 1-2 times  per week (Patient not taking: Reported on 12/26/2020) 1 each 0   famotidine (PEPCID) 20 MG tablet Take 1 tablet (20 mg total) by mouth 2 (two) times daily. 60 tablet 3   metroNIDAZOLE (FLAGYL) 500 MG tablet Take 1 tablet (500 mg total) by mouth 2 (two) times daily. 14 tablet 2   terconazole (TERAZOL 7) 0.4 % vaginal cream Place 1 applicator vaginally at bedtime. 45 g 0     ROS Pertinent positives and negative per HPI, all others reviewed and negative  Physical Exam   BP (!) 97/54 (BP Location: Right Arm)   Pulse 89   Temp 98.1 F (36.7 C) (Oral)   Resp 15   Ht '5\' 2"'  (1.575 m)   Wt 79.8 kg   LMP 08/04/2020   SpO2 97%   BMI 32.17 kg/m   Patient Vitals for the past 24 hrs:  BP Temp Temp src Pulse Resp SpO2 Height Weight  02/07/21 1835 (!) 97/54 -- -- 89 15 97 % -- --  02/07/21 1629 108/67 98.1 F (36.7 C) Oral (!) 102 16 95 % -- --  02/07/21 1623 -- -- -- -- -- -- '5\' 2"'  (1.575 m) 79.8 kg    Physical Exam Vitals reviewed.  Constitutional:      General: She is not in acute distress.    Appearance: She  is well-developed. She is not diaphoretic.  Eyes:     General: No scleral icterus. Pulmonary:     Effort: Pulmonary effort is normal. No respiratory distress.  Abdominal:     General: There is no distension.     Palpations: Abdomen is soft.     Tenderness: There is no abdominal tenderness. There is no guarding or rebound.  Skin:    General: Skin is warm and dry.     Comments: Small abrasions on L lower back  Neurological:     Mental Status: She is alert.     Coordination: Coordination normal.     Cervical Exam    Bedside Ultrasound Not done  My interpretation: n/a  FHT Baseline 130, moderate variability, no accels, no decels Toco: quiet Cat: I  Labs No results found for this or any previous visit (from the past 24 hour(s)).  Imaging No results found.  MAU Course  Procedures Lab Orders  No laboratory test(s) ordered today   No orders of the defined types were placed in this encounter.  Imaging Orders  No imaging studies ordered today    MDM moderate  Assessment and Plan  #Fall in pregnancy, second trimester Patient monitored for four hours after fall, no contractions, NST reactive and reassuring, patient asymptomatic. Discussed return precautions.   #FWB FHT Cat I NST: Reactive   Dispo: Discharged to home in stable condition.  Clarnce Flock, MD/MPH 02/07/21 6:42 PM  Allergies as of 02/07/2021   No Known Allergies      Medication List     TAKE these medications    azithromycin 500 MG tablet Commonly known as: ZITHROMAX Take two tablet at the same time. Please eat with this medication.   Blood Pressure Kit Devi 1 kit by Does not apply route once a week.   Blood Pressure Monitor 3 Devi Please check blood pressure 1-2 times per week   famotidine 20 MG tablet Commonly known as: Pepcid Take 1 tablet (20 mg total) by mouth 2 (two) times daily.   metroNIDAZOLE 500 MG tablet Commonly known as: FLAGYL Take 1 tablet (500 mg total) by  mouth  2 (two) times daily.   terconazole 0.4 % vaginal cream Commonly known as: TERAZOL 7 Place 1 applicator vaginally at bedtime.   Vitafol Gummies 3.33-0.333-34.8 MG Chew Chew 1 tablet by mouth daily.

## 2021-02-07 NOTE — MAU Note (Signed)
Robyn Stone is a 25 y.o. at [redacted]w[redacted]d here in MAU reporting: states around 1500 she was picking up her son from the babysitter and she slipped and fell down the stairs. No direct abdominal trauma, is having left sided pain. Has not checked for bleeding or LOF. Has felt a little bit of FM since the fall.  Onset of complaint: today  Pain score: 6/10  Vitals:   02/07/21 1629  BP: 108/67  Pulse: (!) 102  Resp: 16  Temp: 98.1 F (36.7 C)  SpO2: 95%     FHT:157  Lab orders placed from triage: UA

## 2021-02-11 ENCOUNTER — Encounter: Payer: Medicaid Other | Admitting: Obstetrics

## 2021-02-12 ENCOUNTER — Ambulatory Visit (INDEPENDENT_AMBULATORY_CARE_PROVIDER_SITE_OTHER): Payer: Medicaid Other | Admitting: Nurse Practitioner

## 2021-02-12 ENCOUNTER — Other Ambulatory Visit: Payer: Self-pay

## 2021-02-12 VITALS — BP 113/74 | HR 91 | Wt 173.0 lb

## 2021-02-12 DIAGNOSIS — W19XXXD Unspecified fall, subsequent encounter: Secondary | ICD-10-CM

## 2021-02-12 DIAGNOSIS — Z3A27 27 weeks gestation of pregnancy: Secondary | ICD-10-CM

## 2021-02-12 DIAGNOSIS — Z3490 Encounter for supervision of normal pregnancy, unspecified, unspecified trimester: Secondary | ICD-10-CM

## 2021-02-12 NOTE — Progress Notes (Signed)
Pt states she had a fall last week, was seen at hospital. Pt states she has had more cramping since then. Pt states she has been feeling more dizzy when standing for long periods.

## 2021-02-12 NOTE — Progress Notes (Signed)
    Subjective:  Robyn Stone is a 25 y.o. [phone] at [redacted]w[redacted]d being seen today for ongoing prenatal care.  She is currently monitored for the following issues for this low-risk pregnancy and has Supervision of normal pregnancy, antepartum and Rubella non-immune status, antepartum on their problem list.  Patient reports  back still hurts from fall .  Contractions: Irritability. Vag. Bleeding: None.  Movement: Present. Denies leaking of fluid.   The following portions of the patient's history were reviewed and updated as appropriate: allergies, current medications, past family history, past medical history, past social history, past surgical history and problem list. Problem list updated.  Objective:   Vitals:   02/12/21 1531  BP: 113/74  Pulse: 91  Weight: 173 lb (78.5 kg)    Fetal Status: Fetal Heart Rate (bpm): 130 Fundal Height: 29 cm Movement: Present     General:  Alert, oriented and cooperative. Patient is in no acute distress.  Skin: Skin is warm and dry. No rash noted.   Cardiovascular: Normal heart rate noted  Respiratory: Normal respiratory effort, no problems with respiration noted  Abdomen: Soft, gravid, appropriate for gestational age. Pain/Pressure: Absent     Pelvic:  Cervical exam deferred        Extremities: Normal range of motion.  Edema: None  Mental Status: Normal mood and affect. Normal behavior. Normal judgment and thought content.   Urinalysis:      Assessment and Plan:  Pregnancy: [id number] at [redacted]w[redacted]d  1. Encounter for supervision of normal pregnancy, antepartum, unspecified gravidity Reviewed fluid intake - 64 ounces of water daily  2. Fall, subsequent encounter To use ice pack on her back where she fell.  Bruising is beginning to resolve - yellow and more pale.  3. [redacted] weeks gestation of pregnancy   Preterm labor symptoms and general obstetric precautions including but not limited to vaginal bleeding, contractions, leaking of fluid and fetal movement  were reviewed in detail with the patient. Please refer to After Visit Summary for other counseling recommendations.  Return in about 2 weeks (around 02/26/2021) for ROB.  Nolene Bernheim, RN, MSN, NP-BC Nurse Practitioner, [facility] for [employer], [facility] 02/12/2021 4:02 PM

## 2021-02-12 NOTE — Progress Notes (Signed)
    Subjective:  Robyn Stone is a 25 y.o. [phone] at [redacted]w[redacted]d being seen today for ongoing prenatal care.  She is currently monitored for the following issues for this low-risk pregnancy and has Supervision of normal pregnancy, antepartum and Rubella non-immune status, antepartum on their problem list.  Patient reports  sometimes dizziness when standing .  Contractions: Irritability. Vag. Bleeding: None.  Movement: Present. Denies leaking of fluid.   The following portions of the patient's history were reviewed and updated as appropriate: allergies, current medications, past family history, past medical history, past social history, past surgical history and problem list. Problem list updated.  Objective:   Vitals:   02/12/21 1531  BP: 113/74  Pulse: 91  Weight: 173 lb (78.5 kg)    Fetal Status: Fetal Heart Rate (bpm): 130 Fundal Height: 29 cm Movement: Present     General:  Alert, oriented and cooperative. Patient is in no acute distress.  Skin: Skin is warm and dry. No rash noted.   Cardiovascular: Normal heart rate noted  Respiratory: Normal respiratory effort, no problems with respiration noted  Abdomen: Soft, gravid, appropriate for gestational age. Pain/Pressure: Absent     Pelvic:  Cervical exam deferred        Extremities: Normal range of motion.  Edema: None  Mental Status: Normal mood and affect. Normal behavior. Normal judgment and thought content.   Urinalysis:      Assessment and Plan:  Pregnancy: [id number] at [redacted]w[redacted]d  1. Encounter for supervision of normal pregnancy, antepartum, unspecified gravidity Reviewed fluid intake - 64 ounces of water daily Will have her come for lab visit ASAP for fasting glucola and labs Needs chlamydia TOC - not gotten today - had children with her  2. Fall, subsequent encounter To use ice pack on her back where she fell.  Bruising is beginning to resolve - yellow and more pale. Can also use tylenol by the package direction for  pain  3. [redacted] weeks gestation of pregnancy   Preterm labor symptoms and general obstetric precautions including but not limited to vaginal bleeding, contractions, leaking of fluid and fetal movement were reviewed in detail with the patient. Please refer to After Visit Summary for other counseling recommendations.  Return in about 2 weeks (around 02/26/2021) for ROB.  Nolene Bernheim, RN, MSN, NP-BC Nurse Practitioner, [facility] for [employer], [facility] 02/12/2021 5:00 PM

## 2021-02-25 ENCOUNTER — Other Ambulatory Visit: Payer: Self-pay

## 2021-02-25 ENCOUNTER — Other Ambulatory Visit: Payer: Medicaid Other

## 2021-02-25 ENCOUNTER — Other Ambulatory Visit (HOSPITAL_COMMUNITY)
Admission: RE | Admit: 2021-02-25 | Discharge: 2021-02-25 | Disposition: A | Discharge status: Home / Self Care | Payer: Medicaid Other | Source: Ambulatory Visit | Attending: Obstetrics | Admitting: Obstetrics

## 2021-02-25 ENCOUNTER — Encounter: Payer: Self-pay | Admitting: Obstetrics

## 2021-02-25 ENCOUNTER — Ambulatory Visit (INDEPENDENT_AMBULATORY_CARE_PROVIDER_SITE_OTHER): Payer: Medicaid Other | Admitting: Obstetrics

## 2021-02-25 VITALS — BP 106/70 | HR 87 | Wt 179.0 lb

## 2021-02-25 DIAGNOSIS — A749 Chlamydial infection, unspecified: Secondary | ICD-10-CM

## 2021-02-25 DIAGNOSIS — Z3490 Encounter for supervision of normal pregnancy, unspecified, unspecified trimester: Secondary | ICD-10-CM

## 2021-02-25 DIAGNOSIS — Z3A29 29 weeks gestation of pregnancy: Secondary | ICD-10-CM | POA: Diagnosis not present

## 2021-02-25 NOTE — Progress Notes (Signed)
Subjective:  Robyn Stone is a 25 y.o. [phone] at [redacted]w[redacted]d being seen today for ongoing prenatal care.  She is currently monitored for the following issues for this low-risk pregnancy and has Supervision of normal pregnancy, antepartum and Rubella non-immune status, antepartum on their problem list.  Patient reports backache.  Contractions: Irritability. Vag. Bleeding: None.  Movement: Present. Denies leaking of fluid.   The following portions of the patient's history were reviewed and updated as appropriate: allergies, current medications, past family history, past medical history, past social history, past surgical history and problem list. Problem list updated.  Objective:   Vitals:   02/25/21 0909  BP: 106/70  Pulse: 87  Weight: 179 lb (81.2 kg)    Fetal Status:     Movement: Present     General:  Alert, oriented and cooperative. Patient is in no acute distress.  Skin: Skin is warm and dry. No rash noted.   Cardiovascular: Normal heart rate noted  Respiratory: Normal respiratory effort, no problems with respiration noted  Abdomen: Soft, gravid, appropriate for gestational age. Pain/Pressure: Absent     Pelvic:  Cervical exam deferred        Extremities: Normal range of motion.     Mental Status: Normal mood and affect. Normal behavior. Normal judgment and thought content.   Urinalysis:      Assessment and Plan:  Pregnancy: [id number] at [redacted]w[redacted]d  1. Encounter for supervision of normal pregnancy, antepartum, unspecified gravidity Rx: - Glucose Tolerance, 2 Hours w/1 Hour - CBC - HIV Antibody (routine testing w rflx) - RPR  2. Chlamydia infection, treated - TOC today Rx: - Cervicovaginal ancillary only( [place])    Preterm labor symptoms and general obstetric precautions including but not limited to vaginal bleeding, contractions, leaking of fluid and fetal movement were reviewed in detail with the patient. Please refer to After Visit Summary for other counseling  recommendations.   Return in about 2 weeks (around 03/11/2021) for ROB.   Brock Bad, MD  02/25/21

## 2021-02-25 NOTE — Progress Notes (Signed)
Pt states she is having problems with constipation, has tried Miralax.  Please send Rx today.

## 2021-02-26 LAB — GLUCOSE TOLERANCE, 2 HOURS W/ 1HR
Glucose, 1 hour: 129 mg/dL (ref 65–179)
Glucose, 2 hour: 102 mg/dL (ref 65–152)
Glucose, Fasting: 86 mg/dL (ref 65–91)

## 2021-02-26 LAB — CERVICOVAGINAL ANCILLARY ONLY
Chlamydia: NEGATIVE
Comment: NEGATIVE
Comment: NORMAL
Neisseria Gonorrhea: NEGATIVE

## 2021-02-26 LAB — RPR: RPR Ser Ql: NONREACTIVE

## 2021-02-26 LAB — CBC
Hematocrit: 30.1 % — ABNORMAL LOW (ref 34.0–46.6)
Hemoglobin: 9.6 g/dL — ABNORMAL LOW (ref 11.1–15.9)
MCH: 26.3 pg — ABNORMAL LOW (ref 26.6–33.0)
MCHC: 31.9 g/dL (ref 31.5–35.7)
MCV: 83 fL (ref 79–97)
Platelets: 252 10*3/uL (ref 150–450)
RBC: 3.65 x10E6/uL — ABNORMAL LOW (ref 3.77–5.28)
RDW: 13.7 % (ref 11.7–15.4)
WBC: 7.1 10*3/uL (ref 3.4–10.8)

## 2021-02-26 LAB — HIV ANTIBODY (ROUTINE TESTING W REFLEX): HIV Screen 4th Generation wRfx: NONREACTIVE

## 2021-02-27 ENCOUNTER — Encounter: Payer: Self-pay | Admitting: *Deleted

## 2021-02-27 ENCOUNTER — Other Ambulatory Visit: Payer: Self-pay | Admitting: Obstetrics

## 2021-02-27 DIAGNOSIS — D508 Other iron deficiency anemias: Secondary | ICD-10-CM

## 2021-02-27 MED ORDER — IRON POLYSACCH CMPLX-B12-FA 150-0.025-1 MG PO CAPS
1.0000 | ORAL_CAPSULE | ORAL | 5 refills | Status: DC
Start: 2021-02-27 — End: 2021-07-17

## 2021-02-27 NOTE — Progress Notes (Signed)
TC to patient to notify of anemia and RX for Iron. No answer. VM not available. Patient is active in MyChart. Message sent regarding above. Patient education on Anemia in Pregnancy included.

## 2021-02-28 ENCOUNTER — Telehealth: Payer: Self-pay | Admitting: *Deleted

## 2021-02-28 NOTE — Telephone Encounter (Signed)
TC from patient concerning anemia and taking iron. Further education provided and reassurance. Patient will begin taking iron as directed. MyChart message sent to reinforce previous education and further reassure patient.

## 2021-03-02 DIAGNOSIS — Z419 Encounter for procedure for purposes other than remedying health state, unspecified: Secondary | ICD-10-CM | POA: Diagnosis not present

## 2021-03-11 ENCOUNTER — Encounter: Payer: Self-pay | Admitting: Obstetrics and Gynecology

## 2021-03-11 ENCOUNTER — Other Ambulatory Visit: Payer: Self-pay

## 2021-03-11 ENCOUNTER — Ambulatory Visit (INDEPENDENT_AMBULATORY_CARE_PROVIDER_SITE_OTHER): Payer: Medicaid Other | Admitting: Obstetrics and Gynecology

## 2021-03-11 VITALS — BP 116/78 | HR 97 | Wt 181.0 lb

## 2021-03-11 DIAGNOSIS — O09899 Supervision of other high risk pregnancies, unspecified trimester: Secondary | ICD-10-CM

## 2021-03-11 DIAGNOSIS — Z3480 Encounter for supervision of other normal pregnancy, unspecified trimester: Secondary | ICD-10-CM

## 2021-03-11 DIAGNOSIS — Z2839 Other underimmunization status: Secondary | ICD-10-CM

## 2021-03-11 NOTE — Patient Instructions (Signed)

## 2021-03-11 NOTE — Progress Notes (Signed)
Subjective:  Robyn Stone is a 25 y.o. [phone] at [redacted]w[redacted]d being seen today for ongoing prenatal care.  She is currently monitored for the following issues for this low risk pregnancy and has Supervision of normal pregnancy, antepartum and Rubella non-immune status, antepartum on their problem list.  Patient reports general discomforts of pregnancy.  Contractions: Irritability. Vag. Bleeding: None.  Movement: Present. Denies leaking of fluid.   The following portions of the patient's history were reviewed and updated as appropriate: allergies, current medications, past family history, past medical history, past social history, past surgical history and problem list. Problem list updated.  Objective:   Vitals:   03/11/21 1557  BP: 116/78  Pulse: 97  Weight: 181 lb (82.1 kg)    Fetal Status: Fetal Heart Rate (bpm): 138   Movement: Present     General:  Alert, oriented and cooperative. Patient is in no acute distress.  Skin: Skin is warm and dry. No rash noted.   Cardiovascular: Normal heart rate noted  Respiratory: Normal respiratory effort, no problems with respiration noted  Abdomen: Soft, gravid, appropriate for gestational age. Pain/Pressure: Absent     Pelvic:  Cervical exam deferred        Extremities: Normal range of motion.  Edema: None  Mental Status: Normal mood and affect. Normal behavior. Normal judgment and thought content.   Urinalysis:      Assessment and Plan:  Pregnancy: [id number] at [redacted]w[redacted]d  1. Supervision of other normal pregnancy, antepartum Stable Declined TDAP vaccine  2. Rubella non-immune status, antepartum Vaccine PP  Preterm labor symptoms and general obstetric precautions including but not limited to vaginal bleeding, contractions, leaking of fluid and fetal movement were reviewed in detail with the patient. Please refer to After Visit Summary for other counseling recommendations.  Return in about 2 weeks (around 03/25/2021) for OB visit, face to face,  any provider.   Hermina Staggers, MD

## 2021-03-11 NOTE — Progress Notes (Signed)
+   fetal movement. Pt c/o increased BH contractions

## 2021-03-25 ENCOUNTER — Encounter: Payer: Medicaid Other | Admitting: Advanced Practice Midwife

## 2021-03-28 ENCOUNTER — Other Ambulatory Visit: Payer: Self-pay

## 2021-03-28 ENCOUNTER — Ambulatory Visit (INDEPENDENT_AMBULATORY_CARE_PROVIDER_SITE_OTHER): Payer: Medicaid Other | Admitting: Advanced Practice Midwife

## 2021-03-28 VITALS — BP 117/74 | HR 110 | Wt 184.0 lb

## 2021-03-28 DIAGNOSIS — Z3480 Encounter for supervision of other normal pregnancy, unspecified trimester: Secondary | ICD-10-CM

## 2021-03-28 DIAGNOSIS — M549 Dorsalgia, unspecified: Secondary | ICD-10-CM

## 2021-03-28 DIAGNOSIS — D508 Other iron deficiency anemias: Secondary | ICD-10-CM

## 2021-03-28 DIAGNOSIS — O99891 Other specified diseases and conditions complicating pregnancy: Secondary | ICD-10-CM

## 2021-03-28 DIAGNOSIS — O479 False labor, unspecified: Secondary | ICD-10-CM

## 2021-03-28 DIAGNOSIS — Z3A33 33 weeks gestation of pregnancy: Secondary | ICD-10-CM

## 2021-03-28 NOTE — Progress Notes (Signed)
Pt states she has been having a few more contractions.

## 2021-03-28 NOTE — Patient Instructions (Signed)
Reasons to go to MAU at [facility]:  Since you are preterm, go to MAU if:  1.  Contractions are 10 minutes apart or less and they becoming more uncomfortable or painful over time 2.  You have a large gush of fluid, or a trickle of fluid that will not stop and you have to wear a pad 3.  You have bleeding that is bright red, heavier than spotting--like menstrual bleeding (spotting can be normal in early labor or after a check of your cervix) 4.  You do not feel the baby moving like he/she normally does

## 2021-03-28 NOTE — Progress Notes (Signed)
   PRENATAL VISIT NOTE  Subjective:  Robyn Stone is a 25 y.o. [phone] at [redacted]w[redacted]d being seen today for ongoing prenatal care.  She is currently monitored for the following issues for this low-risk pregnancy and has Supervision of normal pregnancy, antepartum and Rubella non-immune status, antepartum on their problem list.  Patient reports occasional contractions.  Contractions: Irregular. Vag. Bleeding: None.  Movement: Present. Denies leaking of fluid.   The following portions of the patient's history were reviewed and updated as appropriate: allergies, current medications, past family history, past medical history, past social history, past surgical history and problem list.   Objective:   Vitals:   03/28/21 1445  BP: 117/74  Pulse: (!) 110  Weight: 184 lb (83.5 kg)    Fetal Status: Fetal Heart Rate (bpm): 135   Movement: Present     General:  Alert, oriented and cooperative. Patient is in no acute distress.  Skin: Skin is warm and dry. No rash noted.   Cardiovascular: Normal heart rate noted  Respiratory: Normal respiratory effort, no problems with respiration noted  Abdomen: Soft, gravid, appropriate for gestational age.  Pain/Pressure: Present     Pelvic: Cervical exam deferred        Extremities: Normal range of motion.     Mental Status: Normal mood and affect. Normal behavior. Normal judgment and thought content.   Assessment and Plan:  Pregnancy: [id number] at [redacted]w[redacted]d 1. Supervision of other normal pregnancy, antepartum --Anticipatory guidance about next visits/weeks of pregnancy given. --Next visit in 2 weeks  2. Iron deficiency anemia secondary to inadequate dietary iron intake   3. Back pain affecting pregnancy in third trimester --Back and pelvic pain, reports every 2-3 hours menstrual like cramping --PTL precautions reviewed --Rest/ice/heat/warm bath/increase PO fluids/Tylenol/pregnancy support belt  - Culture, OB Urine  4. Braxton Hicks contractions --See  back pain - Culture, OB Urine   5. [redacted] weeks gestation of pregnancy    Preterm labor symptoms and general obstetric precautions including but not limited to vaginal bleeding, contractions, leaking of fluid and fetal movement were reviewed in detail with the patient. Please refer to After Visit Summary for other counseling recommendations.   Return in about 2 weeks (around 04/11/2021).  Future Appointments  Date Time Provider Department Center  04/11/2021 10:35 AM Brock Bad, MD [facility] None    Sharen Counter, CNM

## 2021-04-02 DIAGNOSIS — Z419 Encounter for procedure for purposes other than remedying health state, unspecified: Secondary | ICD-10-CM | POA: Diagnosis not present

## 2021-04-11 ENCOUNTER — Encounter: Payer: Self-pay | Admitting: Obstetrics

## 2021-04-11 ENCOUNTER — Other Ambulatory Visit: Payer: Self-pay

## 2021-04-11 ENCOUNTER — Ambulatory Visit (INDEPENDENT_AMBULATORY_CARE_PROVIDER_SITE_OTHER): Payer: Medicaid Other | Admitting: Obstetrics

## 2021-04-11 VITALS — Wt 186.4 lb

## 2021-04-11 DIAGNOSIS — Z3481 Encounter for supervision of other normal pregnancy, first trimester: Secondary | ICD-10-CM | POA: Diagnosis not present

## 2021-04-11 DIAGNOSIS — Z3480 Encounter for supervision of other normal pregnancy, unspecified trimester: Secondary | ICD-10-CM

## 2021-04-11 MED ORDER — VITAFOL GUMMIES 3.33-0.333-34.8 MG PO CHEW: 1.0000 | CHEWABLE_TABLET | Freq: Every day | ORAL | 11 refills | Status: DC

## 2021-04-11 NOTE — Progress Notes (Signed)
Subjective:  Robyn Stone is a 25 y.o. [phone] at [redacted]w[redacted]d being seen today for ongoing prenatal care.  She is currently monitored for the following issues for this low-risk pregnancy and has Supervision of normal pregnancy, antepartum and Rubella non-immune status, antepartum on their problem list.  Patient reports occasional contractions.  Contractions: Irregular. Vag. Bleeding: None.  Movement: Present. Denies leaking of fluid.   The following portions of the patient's history were reviewed and updated as appropriate: allergies, current medications, past family history, past medical history, past social history, past surgical history and problem list. Problem list updated.  Objective:   Vitals:   04/11/21 1033  Weight: 186 lb 6.4 oz (84.6 kg)    Fetal Status:     Movement: Present     General:  Alert, oriented and cooperative. Patient is in no acute distress.  Skin: Skin is warm and dry. No rash noted.   Cardiovascular: Normal heart rate noted  Respiratory: Normal respiratory effort, no problems with respiration noted  Abdomen: Soft, gravid, appropriate for gestational age. Pain/Pressure: Present     Pelvic:  Cervical exam performed      1 cm / Long / -3 / Vtx  Extremities: Normal range of motion.  Edema: None  Mental Status: Normal mood and affect. Normal behavior. Normal judgment and thought content.   Urinalysis:      Assessment and Plan:  Pregnancy: [id number] at [redacted]w[redacted]d  1. Supervision of other normal pregnancy, antepartum   Preterm labor symptoms and general obstetric precautions including but not limited to vaginal bleeding, contractions, leaking of fluid and fetal movement were reviewed in detail with the patient. Please refer to After Visit Summary for other counseling recommendations.   Return in about 1 week (around 04/18/2021) for ROB.   Brock Bad, MD  04/11/21

## 2021-04-11 NOTE — Addendum Note (Signed)
Addended by: Harrel Lemon on: 04/11/2021 11:36 AM   Modules accepted: Orders

## 2021-04-11 NOTE — Progress Notes (Signed)
ROB 35.[redacted] wks GA UC's Q10 min yesterday, still having some UC's today, less frequent Reports pelvic pain.

## 2021-04-13 LAB — STREP GP B NAA: Strep Gp B NAA: NEGATIVE

## 2021-04-18 ENCOUNTER — Other Ambulatory Visit: Payer: Self-pay

## 2021-04-18 ENCOUNTER — Ambulatory Visit (INDEPENDENT_AMBULATORY_CARE_PROVIDER_SITE_OTHER): Payer: Medicaid Other | Admitting: Obstetrics and Gynecology

## 2021-04-18 VITALS — BP 115/73 | HR 96 | Wt 195.0 lb

## 2021-04-18 DIAGNOSIS — Z3480 Encounter for supervision of other normal pregnancy, unspecified trimester: Secondary | ICD-10-CM | POA: Diagnosis not present

## 2021-04-18 DIAGNOSIS — Z3A36 36 weeks gestation of pregnancy: Secondary | ICD-10-CM

## 2021-04-18 NOTE — Progress Notes (Signed)
   PRENATAL VISIT NOTE  Subjective:  Robyn Stone is a 25 y.o. [phone] at [redacted]w[redacted]d being seen today for ongoing prenatal care.  She is currently monitored for the following issues for this low-risk pregnancy and has Supervision of normal pregnancy, antepartum and Rubella non-immune status, antepartum on their problem list.  Patient reports no complaints.  Contractions: Irregular. Vag. Bleeding: None.  Movement: Present. Denies leaking of fluid.   The following portions of the patient's history were reviewed and updated as appropriate: allergies, current medications, past family history, past medical history, past social history, past surgical history and problem list.   Objective:   Vitals:   04/18/21 1043  BP: 115/73  Pulse: 96  Weight: 195 lb (88.5 kg)    Fetal Status: Fetal Heart Rate (bpm): 134 Fundal Height: 36 cm Movement: Present     General:  Alert, oriented and cooperative. Patient is in no acute distress.  Skin: Skin is warm and dry. No rash noted.   Cardiovascular: Normal heart rate noted  Respiratory: Normal respiratory effort, no problems with respiration noted  Abdomen: Soft, gravid, appropriate for gestational age.  Pain/Pressure: Absent     Pelvic: Cervical exam deferred        Extremities: Normal range of motion.  Edema: None  Mental Status: Normal mood and affect. Normal behavior. Normal judgment and thought content.   Assessment and Plan:  Pregnancy: [id number] at [redacted]w[redacted]d 1. Supervision of other normal pregnancy, antepartum F/u CBC for anemia, patient has been taking iron  2. [redacted] weeks gestation of pregnancy  Preterm labor symptoms and general obstetric precautions including but not limited to vaginal bleeding, contractions, leaking of fluid and fetal movement were reviewed in detail with the patient. Please refer to After Visit Summary for other counseling recommendations.   Return in about 1 week (around 04/25/2021) for low OB, in person.  Future  Appointments  Date Time Provider Department Center  04/23/2021  3:30 PM Brock Bad, MD [facility] None    Conan Bowens, MD

## 2021-04-19 LAB — CBC
Hematocrit: 28.9 % — ABNORMAL LOW (ref 34.0–46.6)
Hemoglobin: 9.4 g/dL — ABNORMAL LOW (ref 11.1–15.9)
MCH: 24 pg — ABNORMAL LOW (ref 26.6–33.0)
MCHC: 32.5 g/dL (ref 31.5–35.7)
MCV: 74 fL — ABNORMAL LOW (ref 79–97)
Platelets: 265 10*3/uL (ref 150–450)
RBC: 3.91 x10E6/uL (ref 3.77–5.28)
RDW: 14.6 % (ref 11.7–15.4)
WBC: 8.8 10*3/uL (ref 3.4–10.8)

## 2021-04-20 ENCOUNTER — Inpatient Hospital Stay (HOSPITAL_COMMUNITY)
Admission: AD | Admit: 2021-04-20 | Discharge: 2021-04-20 | Disposition: A | Discharge status: Home / Self Care | Payer: Medicaid Other | Attending: Obstetrics and Gynecology | Admitting: Obstetrics and Gynecology

## 2021-04-20 ENCOUNTER — Other Ambulatory Visit: Payer: Self-pay

## 2021-04-20 DIAGNOSIS — O99513 Diseases of the respiratory system complicating pregnancy, third trimester: Secondary | ICD-10-CM | POA: Insufficient documentation

## 2021-04-20 DIAGNOSIS — Z20822 Contact with and (suspected) exposure to covid-19: Secondary | ICD-10-CM | POA: Diagnosis not present

## 2021-04-20 DIAGNOSIS — R6889 Other general symptoms and signs: Secondary | ICD-10-CM

## 2021-04-20 DIAGNOSIS — Z3A37 37 weeks gestation of pregnancy: Secondary | ICD-10-CM | POA: Diagnosis not present

## 2021-04-20 DIAGNOSIS — J029 Acute pharyngitis, unspecified: Secondary | ICD-10-CM | POA: Diagnosis not present

## 2021-04-20 DIAGNOSIS — R519 Headache, unspecified: Secondary | ICD-10-CM | POA: Insufficient documentation

## 2021-04-20 LAB — RESP PANEL BY RT-PCR (FLU A&B, COVID) ARPGX2
Influenza A by PCR: NEGATIVE
Influenza B by PCR: NEGATIVE
SARS Coronavirus 2 by RT PCR: NEGATIVE

## 2021-04-20 LAB — GROUP A STREP BY PCR: Group A Strep by PCR: NOT DETECTED

## 2021-04-20 MED ORDER — GUAIFENESIN 100 MG/5ML PO LIQD: 100.0000 mg | ORAL | 1 refills | Status: DC | PRN

## 2021-04-20 MED ORDER — ACETAMINOPHEN 500 MG PO TABS: 500.0000 mg | ORAL_TABLET | Freq: Four times a day (QID) | ORAL | 2 refills | Status: AC | PRN

## 2021-04-20 MED ORDER — BENZOCAINE-MENTHOL 6-10 MG MT LOZG: 1.0000 | LOZENGE | OROMUCOSAL | 2 refills | Status: DC | PRN

## 2021-04-20 NOTE — MAU Provider Note (Signed)
Event Date/Time   First Provider Initiated Contact with Patient 04/20/21 0339      S Ms. Robyn Stone is a 25 y.o. [phone] patient who presents to MAU today with complaint of sore throat, headache, and nostril burning with inhalation. She states her symptoms started "around Tuesday."  She states she has not been around any known sick individuals, but reports her son has had a cough.  She reports she has not tried anything for her symptoms.  She is not vaccinated for Covid or influenza.  She endorses fetal movement and denies vaginal concerns including bleeding and leaking.  She also denies abdominal cramping, but reports some occasional contractions.   O BP 122/87 (BP Location: Right Arm)   Pulse 95   Temp 98.1 F (36.7 C) (Oral)   Resp 17   Ht 5\' 2"  (1.575 m)   Wt 88 kg   LMP 08/04/2020   SpO2 100%   BMI 35.50 kg/m  Review of Systems  Constitutional:  Negative for chills and fever.  HENT:  Positive for sore throat.   Respiratory:  Positive for cough.   Gastrointestinal:  Negative for nausea and vomiting.  Neurological:  Positive for headaches.   Physical Exam Constitutional:      Appearance: She is well-developed.  HENT:     Head: Normocephalic and atraumatic.     Mouth/Throat:     Mouth: Mucous membranes are moist.     Pharynx: No posterior oropharyngeal erythema or uvula swelling.     Tonsils: No tonsillar exudate or tonsillar abscesses. 0 on the right. 0 on the left.  Eyes:     Conjunctiva/sclera: Conjunctivae normal.  Cardiovascular:     Rate and Rhythm: Normal rate and regular rhythm.     Heart sounds: Normal heart sounds.  Pulmonary:     Effort: Pulmonary effort is normal. No respiratory distress.     Breath sounds: Normal breath sounds.  Abdominal:     Comments: Gravid, Appears AGA  Musculoskeletal:        General: Normal range of motion.     Cervical back: Normal range of motion.  Skin:    General: Skin is warm and dry.  Neurological:     Mental  Status: She is alert and oriented to person, place, and time.  Psychiatric:        Mood and Affect: Mood normal.        Behavior: Behavior normal.    A 25 year old 22 at 37 weeks  Sore Throat Flu Like Symptoms  P Strep and Influenza Swab obtained Given list of pregnancy safe medications Encouraged isolation. Rx for tylenol, robitussin, and throat lozenges sent to pharmacy on file.  Message sent to [facility] for appt to be changed to virtual format. Patient without questions.  Discharge from MAU in stable condition  [facility], Gerrit Heck 04/20/2021 3:40 AM

## 2021-04-20 NOTE — Discharge Instructions (Signed)

## 2021-04-20 NOTE — MAU Note (Signed)
..  Robyn Stone is a 25 y.o. at [redacted]w[redacted]d here in MAU reporting: Sore throat for 3 days and cough and nasal congestion for a week. Has not taken anything for it. Denies abdominal pain, vaginal bleeding, or LOF. +FM  Pain score: 7/10 Vitals:   04/20/21 0321  BP: 122/87  Pulse: 95  Resp: 17  Temp: 98.1 F (36.7 C)  SpO2: 100%     FHT:112

## 2021-04-23 ENCOUNTER — Encounter: Payer: Self-pay | Admitting: Obstetrics

## 2021-04-23 ENCOUNTER — Telehealth (INDEPENDENT_AMBULATORY_CARE_PROVIDER_SITE_OTHER): Payer: Medicaid Other | Admitting: Obstetrics

## 2021-04-23 DIAGNOSIS — O99891 Other specified diseases and conditions complicating pregnancy: Secondary | ICD-10-CM

## 2021-04-23 DIAGNOSIS — Z3A37 37 weeks gestation of pregnancy: Secondary | ICD-10-CM

## 2021-04-23 DIAGNOSIS — R6889 Other general symptoms and signs: Secondary | ICD-10-CM

## 2021-04-23 DIAGNOSIS — Z3480 Encounter for supervision of other normal pregnancy, unspecified trimester: Secondary | ICD-10-CM

## 2021-04-23 NOTE — Progress Notes (Signed)
Virtual Visit via Video Note  I connected with Robyn Stone on 04/23/21 at  3:30 PM EST by a video enabled telemedicine application and verified that I am speaking with the correct person using two identifiers.  Pt does not have BP cuff with her at this time.

## 2021-04-23 NOTE — Progress Notes (Addendum)
Subjective:  Robyn Stone is a 25 y.o. [phone] at 37w3` being seen today for ongoing prenatal care.  She is currently monitored for the following issues for this low-risk pregnancy and has Supervision of normal pregnancy, antepartum and Rubella non-immune status, antepartum on their problem list.  Patient reports flu-like symptoms for the past week.  Has been seen in the ER and is taking medication.  Has had some continued nausea, cough and sore the but she feels that she is improving.  Contractions: Irregular. Vag. Bleeding: None.  Movement: Present. Denies leaking of fluid.   The following portions of the patient's history were reviewed and updated as appropriate: allergies, current medications, past family history, past medical history, past social history, past surgical history and problem list. Problem list updated.  Objective:  There were no vitals filed for this visit.  Fetal Status:     Movement: Present     General:  Alert, oriented and cooperative. Patient is in no acute distress.  Skin: Skin is warm and dry. No rash noted.   Cardiovascular: Normal heart rate noted  Respiratory: Normal respiratory effort, no problems with respiration noted  Abdomen: Soft, gravid, appropriate for gestational age. Pain/Pressure: Absent     Pelvic:  Cervical exam deferred        Extremities: Normal range of motion.  Edema: None  Mental Status: Normal mood and affect. Normal behavior. Normal judgment and thought content.   Urinalysis:      Assessment and Plan:  Pregnancy: [id number] at [redacted]w[redacted]d  1. Supervision of other normal pregnancy, antepartum  2. Flu-like symptoms, resolving except for nausea, sore throat and decreased appetite - continue meds, fluids and rest - recommended chicken soup, saltines and orange juice in small amounts frequently    Term labor symptoms and general obstetric precautions including but not limited to vaginal bleeding, contractions, leaking of fluid and fetal  movement were reviewed in detail with the patient. Please refer to After Visit Summary for other counseling recommendations.   Return in about 1 week (around 04/30/2021) for ROB.   Brock Bad, MD  04/23/21

## 2021-05-01 ENCOUNTER — Other Ambulatory Visit: Payer: Self-pay

## 2021-05-01 ENCOUNTER — Ambulatory Visit (INDEPENDENT_AMBULATORY_CARE_PROVIDER_SITE_OTHER): Payer: Medicaid Other | Admitting: Obstetrics

## 2021-05-01 ENCOUNTER — Encounter: Payer: Self-pay | Admitting: Obstetrics

## 2021-05-01 VITALS — BP 114/85 | HR 98 | Wt 213.0 lb

## 2021-05-01 DIAGNOSIS — Z3480 Encounter for supervision of other normal pregnancy, unspecified trimester: Secondary | ICD-10-CM

## 2021-05-01 NOTE — Progress Notes (Signed)
Pt presents for ROB c/o sharp vaginal pain.  Pt requests cx check today.

## 2021-05-01 NOTE — Progress Notes (Signed)
Subjective:  Robyn Stone is a 25 y.o. [phone] at [redacted]w[redacted]d being seen today for ongoing prenatal care.  She is currently monitored for the following issues for this low-risk pregnancy and has Supervision of normal pregnancy, antepartum and Rubella non-immune status, antepartum on their problem list.  Patient reports occasional contractions.  Contractions: Irregular. Vag. Bleeding: None.  Movement: Present. Denies leaking of fluid.   The following portions of the patient's history were reviewed and updated as appropriate: allergies, current medications, past family history, past medical history, past social history, past surgical history and problem list. Problem list updated.  Objective:   Vitals:   05/01/21 1119  BP: 114/85  Pulse: 98  Weight: 213 lb (96.6 kg)    Fetal Status:     Movement: Present     General:  Alert, oriented and cooperative. Patient is in no acute distress.  Skin: Skin is warm and dry. No rash noted.   Cardiovascular: Normal heart rate noted  Respiratory: Normal respiratory effort, no problems with respiration noted  Abdomen: Soft, gravid, appropriate for gestational age. Pain/Pressure: Absent     Pelvic:  Cervical exam deferred        Extremities: Normal range of motion.  Edema: None  Mental Status: Normal mood and affect. Normal behavior. Normal judgment and thought content.   Urinalysis:      Assessment and Plan:  Pregnancy: [id number] at [redacted]w[redacted]d  There are no diagnoses linked to this encounter. Term labor symptoms and general obstetric precautions including but not limited to vaginal bleeding, contractions, leaking of fluid and fetal movement were reviewed in detail with the patient. Please refer to After Visit Summary for other counseling recommendations.   Return in about 1 week (around 05/08/2021) for ROB.   Brock Bad, MD

## 2021-05-02 DIAGNOSIS — Z419 Encounter for procedure for purposes other than remedying health state, unspecified: Secondary | ICD-10-CM | POA: Diagnosis not present

## 2021-05-04 ENCOUNTER — Inpatient Hospital Stay (HOSPITAL_COMMUNITY)
Admission: AD | Admit: 2021-05-04 | Discharge: 2021-05-04 | Disposition: A | Discharge status: Home / Self Care | Payer: Medicaid Other | Attending: Obstetrics & Gynecology | Admitting: Obstetrics & Gynecology

## 2021-05-04 ENCOUNTER — Other Ambulatory Visit: Payer: Self-pay

## 2021-05-04 DIAGNOSIS — Z3A39 39 weeks gestation of pregnancy: Secondary | ICD-10-CM | POA: Insufficient documentation

## 2021-05-04 DIAGNOSIS — O479 False labor, unspecified: Secondary | ICD-10-CM | POA: Diagnosis not present

## 2021-05-04 DIAGNOSIS — O471 False labor at or after 37 completed weeks of gestation: Secondary | ICD-10-CM | POA: Insufficient documentation

## 2021-05-04 NOTE — MAU Provider Note (Signed)
DEBRAH GRANDERSON is a 25 y.o. [phone] female at [redacted]w[redacted]d.  RN labor check, not seen by provider. Patient stated that she came to MAU due to contractions every 15-20 minutes. No longer having pain. Does not want her cervix checked.   SVE by RN: Patient declined   NST: FHR baseline 130 bpm, Variability: moderate, Accelerations: present, Decelerations:  Absent= Cat 1/Reactive Toco: Uterine irritability, 1 contraction on NST  Assessment and Plan: - 1 contraction on tocometer in MAU  - Cat 1/reactive NST - Declined SVE - Not in active labor, no longer having any painful contractions  - Stable for discharge home with labor precautions  Worthy Rancher, MD  05/04/2021 3:01 AM

## 2021-05-04 NOTE — MAU Note (Signed)
..  Robyn Stone is a 25 y.o. at [redacted]w[redacted]d here in MAU reporting: contractions every 15-20 minutes that began today around 2-3 hours ago. Denies vaginal bleeding or leaking of fluid. +FM  Pain score: 3/10 Vitals:   05/04/21 0120  BP: 130/81  Pulse: 92  Resp: 18  Temp: 98 F (36.7 C)  SpO2: 100%     FHT:134

## 2021-05-04 NOTE — MAU Note (Signed)
.  I have communicated with Evalina Field, MD and reviewed vital signs:  Vitals:   05/04/21 0340 05/04/21 0343  BP:  123/70  Pulse:  85  Resp:    Temp:    SpO2: 99%     Vaginal exam:  patient declined. Stated she has an exam on Wednesday and isn't really hurting now ,   Also reviewed contraction pattern and that non-stress test is reactive.  It has been documented that patient is contracting occasionally not indicating active labor.  Patient denies any other complaints.  Based on this report provider has given order for discharge.  A discharge order and diagnosis entered by a provider.   Labor discharge instructions reviewed with patient.

## 2021-05-08 ENCOUNTER — Ambulatory Visit (INDEPENDENT_AMBULATORY_CARE_PROVIDER_SITE_OTHER): Payer: Medicaid Other | Admitting: Obstetrics & Gynecology

## 2021-05-08 ENCOUNTER — Other Ambulatory Visit: Payer: Self-pay | Admitting: Advanced Practice Midwife

## 2021-05-08 ENCOUNTER — Other Ambulatory Visit: Payer: Self-pay

## 2021-05-08 ENCOUNTER — Other Ambulatory Visit: Payer: Self-pay | Admitting: Obstetrics & Gynecology

## 2021-05-08 VITALS — BP 121/88 | HR 76 | Wt 196.0 lb

## 2021-05-08 DIAGNOSIS — Z3480 Encounter for supervision of other normal pregnancy, unspecified trimester: Secondary | ICD-10-CM

## 2021-05-08 NOTE — Progress Notes (Signed)
   PRENATAL VISIT NOTE  Subjective:  Robyn Stone is a 25 y.o. [phone] at [redacted]w[redacted]d being seen today for ongoing prenatal care.  She is currently monitored for the following issues for this low-risk pregnancy and has Supervision of normal pregnancy, antepartum and Rubella non-immune status, antepartum on their problem list.  Patient reports occasional contractions.  Contractions: Irregular. Vag. Bleeding: None.  Movement: Present. Denies leaking of fluid.   The following portions of the patient's history were reviewed and updated as appropriate: allergies, current medications, past family history, past medical history, past social history, past surgical history and problem list.   Objective:   Vitals:   05/08/21 1010  BP: 121/88  Pulse: 76  Weight: 196 lb (88.9 kg)    Fetal Status: Fetal Heart Rate (bpm): 139   Movement: Present     General:  Alert, oriented and cooperative. Patient is in no acute distress.  Skin: Skin is warm and dry. No rash noted.   Cardiovascular: Normal heart rate noted  Respiratory: Normal respiratory effort, no problems with respiration noted  Abdomen: Soft, gravid, appropriate for gestational age.  Pain/Pressure: Present     Pelvic: Cervical exam deferred        Extremities: Normal range of motion.  Edema: None  Mental Status: Normal mood and affect. Normal behavior. Normal judgment and thought content.   Assessment and Plan:  Pregnancy: [id number] at [redacted]w[redacted]d 1. Supervision of other normal pregnancy, antepartum  BP with DBP 88, will IOL 40 weeks Term labor symptoms and general obstetric precautions including but not limited to vaginal bleeding, contractions, leaking of fluid and fetal movement were reviewed in detail with the patient. Please refer to After Visit Summary for other counseling recommendations.   Return if symptoms worsen or fail to improve, for postpartum.  No future appointments.  Scheryl Darter, MD

## 2021-05-10 ENCOUNTER — Encounter (HOSPITAL_COMMUNITY): Payer: Self-pay | Admitting: Obstetrics & Gynecology

## 2021-05-10 ENCOUNTER — Other Ambulatory Visit: Payer: Self-pay

## 2021-05-10 ENCOUNTER — Inpatient Hospital Stay (HOSPITAL_COMMUNITY)
Admission: AD | Admit: 2021-05-10 | Discharge: 2021-05-10 | Disposition: A | Discharge status: Home / Self Care | Payer: Medicaid Other | Attending: Obstetrics & Gynecology | Admitting: Obstetrics & Gynecology

## 2021-05-10 DIAGNOSIS — O480 Post-term pregnancy: Secondary | ICD-10-CM | POA: Diagnosis not present

## 2021-05-10 DIAGNOSIS — Z3A40 40 weeks gestation of pregnancy: Secondary | ICD-10-CM | POA: Insufficient documentation

## 2021-05-10 DIAGNOSIS — O4703 False labor before 37 completed weeks of gestation, third trimester: Secondary | ICD-10-CM | POA: Insufficient documentation

## 2021-05-10 DIAGNOSIS — O479 False labor, unspecified: Secondary | ICD-10-CM

## 2021-05-10 DIAGNOSIS — Z3490 Encounter for supervision of normal pregnancy, unspecified, unspecified trimester: Secondary | ICD-10-CM

## 2021-05-10 MED ORDER — LIDOCAINE HCL (PF) 1 % IJ SOLN: 30.0000 mL | INTRAMUSCULAR | Status: DC | PRN

## 2021-05-10 MED ORDER — OXYCODONE-ACETAMINOPHEN 5-325 MG PO TABS: 1.0000 | ORAL_TABLET | ORAL | Status: DC | PRN

## 2021-05-10 MED ORDER — LACTATED RINGERS IV SOLN: INTRAVENOUS | Status: DC

## 2021-05-10 MED ORDER — ONDANSETRON HCL 4 MG/2ML IJ SOLN: 4.0000 mg | Freq: Four times a day (QID) | INTRAMUSCULAR | Status: DC | PRN

## 2021-05-10 MED ORDER — ACETAMINOPHEN 325 MG PO TABS: 650.0000 mg | ORAL_TABLET | ORAL | Status: DC | PRN

## 2021-05-10 MED ORDER — SOD CITRATE-CITRIC ACID 500-334 MG/5ML PO SOLN: 30.0000 mL | ORAL | Status: DC | PRN

## 2021-05-10 MED ORDER — LACTATED RINGERS IV SOLN: 500.0000 mL | INTRAVENOUS | Status: DC | PRN

## 2021-05-10 MED ORDER — OXYTOCIN BOLUS FROM INFUSION: 333.0000 mL | Freq: Once | INTRAVENOUS | Status: DC

## 2021-05-10 MED ORDER — OXYTOCIN-SODIUM CHLORIDE 30-0.9 UT/500ML-% IV SOLN: 2.5000 [IU]/h | INTRAVENOUS | Status: DC

## 2021-05-10 MED ORDER — OXYCODONE-ACETAMINOPHEN 5-325 MG PO TABS: 2.0000 | ORAL_TABLET | ORAL | Status: DC | PRN

## 2021-05-10 NOTE — MAU Note (Addendum)
Ctxs since 2300. Denies VB or LOF. 1.5cm last sve. For IOL Sat AM. Good FM

## 2021-05-11 ENCOUNTER — Inpatient Hospital Stay (HOSPITAL_COMMUNITY)
Admission: AD | Admit: 2021-05-11 | Discharge: 2021-05-12 | DRG: 807 | Disposition: A | Discharge status: Home / Self Care | Payer: Medicaid Other | Attending: Obstetrics and Gynecology | Admitting: Obstetrics and Gynecology

## 2021-05-11 ENCOUNTER — Other Ambulatory Visit: Payer: Self-pay

## 2021-05-11 ENCOUNTER — Encounter (HOSPITAL_COMMUNITY): Payer: Self-pay | Admitting: Obstetrics and Gynecology

## 2021-05-11 ENCOUNTER — Inpatient Hospital Stay (HOSPITAL_COMMUNITY): Payer: Medicaid Other

## 2021-05-11 ENCOUNTER — Inpatient Hospital Stay (HOSPITAL_COMMUNITY): Payer: Medicaid Other | Admitting: Anesthesiology

## 2021-05-11 DIAGNOSIS — Z23 Encounter for immunization: Secondary | ICD-10-CM

## 2021-05-11 DIAGNOSIS — O26893 Other specified pregnancy related conditions, third trimester: Secondary | ICD-10-CM | POA: Diagnosis not present

## 2021-05-11 DIAGNOSIS — O134 Gestational [pregnancy-induced] hypertension without significant proteinuria, complicating childbirth: Secondary | ICD-10-CM | POA: Diagnosis not present

## 2021-05-11 DIAGNOSIS — Z3A40 40 weeks gestation of pregnancy: Secondary | ICD-10-CM

## 2021-05-11 DIAGNOSIS — Z20822 Contact with and (suspected) exposure to covid-19: Secondary | ICD-10-CM | POA: Diagnosis not present

## 2021-05-11 DIAGNOSIS — Z3480 Encounter for supervision of other normal pregnancy, unspecified trimester: Secondary | ICD-10-CM

## 2021-05-11 DIAGNOSIS — Z87891 Personal history of nicotine dependence: Secondary | ICD-10-CM | POA: Diagnosis not present

## 2021-05-11 DIAGNOSIS — O09899 Supervision of other high risk pregnancies, unspecified trimester: Secondary | ICD-10-CM

## 2021-05-11 LAB — TYPE AND SCREEN
ABO/RH(D): A POS
Antibody Screen: NEGATIVE

## 2021-05-11 LAB — CBC
HCT: 32.1 % — ABNORMAL LOW (ref 36.0–46.0)
Hemoglobin: 10.1 g/dL — ABNORMAL LOW (ref 12.0–15.0)
MCH: 23.4 pg — ABNORMAL LOW (ref 26.0–34.0)
MCHC: 31.5 g/dL (ref 30.0–36.0)
MCV: 74.3 fL — ABNORMAL LOW (ref 80.0–100.0)
Platelets: 251 10*3/uL (ref 150–400)
RBC: 4.32 MIL/uL (ref 3.87–5.11)
RDW: 16.2 % — ABNORMAL HIGH (ref 11.5–15.5)
WBC: 10.5 10*3/uL (ref 4.0–10.5)
nRBC: 0 % (ref 0.0–0.2)

## 2021-05-11 LAB — RESP PANEL BY RT-PCR (FLU A&B, COVID) ARPGX2
Influenza A by PCR: NEGATIVE
Influenza B by PCR: NEGATIVE
SARS Coronavirus 2 by RT PCR: NEGATIVE

## 2021-05-11 MED ORDER — ONDANSETRON HCL 4 MG/2ML IJ SOLN: 4.0000 mg | INTRAMUSCULAR | Status: DC | PRN

## 2021-05-11 MED ORDER — LIDOCAINE HCL (PF) 1 % IJ SOLN: 30.0000 mL | INTRAMUSCULAR | Status: DC | PRN

## 2021-05-11 MED ORDER — IBUPROFEN 600 MG PO TABS
600.0000 mg | ORAL_TABLET | Freq: Four times a day (QID) | ORAL | Status: DC
  Administered 2021-05-12 (×4): 600 mg via ORAL
  Filled 2021-05-11 (×4): qty 1

## 2021-05-11 MED ORDER — MISOPROSTOL 25 MCG QUARTER TABLET
25.0000 ug | ORAL_TABLET | ORAL | Status: DC | PRN
  Administered 2021-05-11: 25 ug via VAGINAL
  Filled 2021-05-11: qty 1

## 2021-05-11 MED ORDER — OXYTOCIN-SODIUM CHLORIDE 30-0.9 UT/500ML-% IV SOLN
1.0000 m[IU]/min | INTRAVENOUS | Status: DC
  Administered 2021-05-11: 2 m[IU]/min via INTRAVENOUS

## 2021-05-11 MED ORDER — BUPIVACAINE HCL (PF) 0.25 % IJ SOLN
INTRAMUSCULAR | Status: DC | PRN
  Administered 2021-05-11: 1.3 mL via INTRATHECAL

## 2021-05-11 MED ORDER — TETANUS-DIPHTH-ACELL PERTUSSIS 5-2.5-18.5 LF-MCG/0.5 IM SUSY
0.5000 mL | PREFILLED_SYRINGE | Freq: Once | INTRAMUSCULAR | Status: AC
  Administered 2021-05-12: 0.5 mL via INTRAMUSCULAR
  Filled 2021-05-11: qty 0.5

## 2021-05-11 MED ORDER — OXYTOCIN BOLUS FROM INFUSION
333.0000 mL | Freq: Once | INTRAVENOUS | Status: AC
  Administered 2021-05-11: 333 mL via INTRAVENOUS

## 2021-05-11 MED ORDER — PHENYLEPHRINE 40 MCG/ML (10ML) SYRINGE FOR IV PUSH (FOR BLOOD PRESSURE SUPPORT)
80.0000 ug | PREFILLED_SYRINGE | INTRAVENOUS | Status: DC | PRN
  Filled 2021-05-11: qty 10

## 2021-05-11 MED ORDER — TERBUTALINE SULFATE 1 MG/ML IJ SOLN: 0.2500 mg | Freq: Once | INTRAMUSCULAR | Status: DC | PRN

## 2021-05-11 MED ORDER — ACETAMINOPHEN 325 MG PO TABS: 650.0000 mg | ORAL_TABLET | ORAL | Status: DC | PRN

## 2021-05-11 MED ORDER — LEVONORGESTREL 20.1 MCG/DAY IU IUD: 1.0000 | INTRAUTERINE_SYSTEM | Freq: Once | INTRAUTERINE | Status: DC

## 2021-05-11 MED ORDER — FENTANYL CITRATE (PF) 100 MCG/2ML IJ SOLN: 50.0000 ug | INTRAMUSCULAR | Status: DC | PRN

## 2021-05-11 MED ORDER — PHENYLEPHRINE 40 MCG/ML (10ML) SYRINGE FOR IV PUSH (FOR BLOOD PRESSURE SUPPORT): 80.0000 ug | PREFILLED_SYRINGE | INTRAVENOUS | Status: DC | PRN

## 2021-05-11 MED ORDER — BENZOCAINE-MENTHOL 20-0.5 % EX AERO: 1.0000 "application " | INHALATION_SPRAY | CUTANEOUS | Status: DC | PRN

## 2021-05-11 MED ORDER — ACETAMINOPHEN 325 MG PO TABS
650.0000 mg | ORAL_TABLET | ORAL | Status: DC | PRN
  Administered 2021-05-11 – 2021-05-12 (×3): 650 mg via ORAL
  Filled 2021-05-11 (×4): qty 2

## 2021-05-11 MED ORDER — DIPHENHYDRAMINE HCL 50 MG/ML IJ SOLN: 12.5000 mg | INTRAMUSCULAR | Status: DC | PRN

## 2021-05-11 MED ORDER — OXYCODONE HCL 5 MG PO TABS: 5.0000 mg | ORAL_TABLET | ORAL | Status: DC | PRN

## 2021-05-11 MED ORDER — FENTANYL CITRATE (PF) 100 MCG/2ML IJ SOLN
50.0000 ug | INTRAMUSCULAR | Status: DC | PRN
  Administered 2021-05-11: 100 ug via INTRAVENOUS
  Filled 2021-05-11: qty 2

## 2021-05-11 MED ORDER — FENTANYL-BUPIVACAINE-NACL 0.5-0.125-0.9 MG/250ML-% EP SOLN
EPIDURAL | Status: DC | PRN
  Administered 2021-05-11: 12 mL/h via EPIDURAL

## 2021-05-11 MED ORDER — PRENATAL MULTIVITAMIN CH
1.0000 | ORAL_TABLET | Freq: Every day | ORAL | Status: DC
  Administered 2021-05-12: 1 via ORAL
  Filled 2021-05-11: qty 1

## 2021-05-11 MED ORDER — SENNOSIDES-DOCUSATE SODIUM 8.6-50 MG PO TABS
2.0000 | ORAL_TABLET | Freq: Every day | ORAL | Status: DC
  Administered 2021-05-12: 2 via ORAL
  Filled 2021-05-11: qty 2

## 2021-05-11 MED ORDER — OXYCODONE-ACETAMINOPHEN 5-325 MG PO TABS: 1.0000 | ORAL_TABLET | ORAL | Status: DC | PRN

## 2021-05-11 MED ORDER — OXYCODONE-ACETAMINOPHEN 5-325 MG PO TABS: 2.0000 | ORAL_TABLET | ORAL | Status: DC | PRN

## 2021-05-11 MED ORDER — SOD CITRATE-CITRIC ACID 500-334 MG/5ML PO SOLN: 30.0000 mL | ORAL | Status: DC | PRN

## 2021-05-11 MED ORDER — DIBUCAINE (PERIANAL) 1 % EX OINT: 1.0000 "application " | TOPICAL_OINTMENT | CUTANEOUS | Status: DC | PRN

## 2021-05-11 MED ORDER — WITCH HAZEL-GLYCERIN EX PADS: 1.0000 "application " | MEDICATED_PAD | CUTANEOUS | Status: DC | PRN

## 2021-05-11 MED ORDER — OXYTOCIN-SODIUM CHLORIDE 30-0.9 UT/500ML-% IV SOLN
2.5000 [IU]/h | INTRAVENOUS | Status: DC
  Administered 2021-05-11: 2.5 [IU]/h via INTRAVENOUS
  Filled 2021-05-11: qty 500

## 2021-05-11 MED ORDER — EPHEDRINE 5 MG/ML INJ: 10.0000 mg | INTRAVENOUS | Status: DC | PRN

## 2021-05-11 MED ORDER — LACTATED RINGERS IV SOLN: 500.0000 mL | INTRAVENOUS | Status: DC | PRN

## 2021-05-11 MED ORDER — ONDANSETRON HCL 4 MG PO TABS: 4.0000 mg | ORAL_TABLET | ORAL | Status: DC | PRN

## 2021-05-11 MED ORDER — FENTANYL-BUPIVACAINE-NACL 0.5-0.125-0.9 MG/250ML-% EP SOLN
12.0000 mL/h | EPIDURAL | Status: DC | PRN
  Filled 2021-05-11: qty 250

## 2021-05-11 MED ORDER — COCONUT OIL OIL: 1.0000 "application " | TOPICAL_OIL | Status: DC | PRN

## 2021-05-11 MED ORDER — DIPHENHYDRAMINE HCL 25 MG PO CAPS: 25.0000 mg | ORAL_CAPSULE | Freq: Four times a day (QID) | ORAL | Status: DC | PRN

## 2021-05-11 MED ORDER — SIMETHICONE 80 MG PO CHEW: 80.0000 mg | CHEWABLE_TABLET | ORAL | Status: DC | PRN

## 2021-05-11 MED ORDER — LACTATED RINGERS IV SOLN: 500.0000 mL | Freq: Once | INTRAVENOUS | Status: DC

## 2021-05-11 MED ORDER — LACTATED RINGERS IV SOLN: INTRAVENOUS | Status: DC

## 2021-05-11 MED ORDER — ONDANSETRON HCL 4 MG/2ML IJ SOLN: 4.0000 mg | Freq: Four times a day (QID) | INTRAMUSCULAR | Status: DC | PRN

## 2021-05-11 NOTE — Anesthesia Procedure Notes (Signed)
Epidural Patient location during procedure: OB Start time: 05/11/2021 5:11 PM End time: 05/11/2021 5:21 PM  Staffing Anesthesiologist: Lannie Fields, DO Performed: anesthesiologist   Preanesthetic Checklist Completed: patient identified, IV checked, risks and benefits discussed, monitors and equipment checked, pre-op evaluation and timeout performed  Epidural Patient position: sitting Prep: DuraPrep and site prepped and draped Patient monitoring: continuous pulse ox, blood pressure, heart rate and cardiac monitor Approach: midline Location: L3-L4 Injection technique: LOR air  Needle:  Needle type: Tuohy  Needle gauge: 17 G Needle length: 9 cm Needle insertion depth: 9 cm Catheter type: closed end flexible Catheter size: 19 Gauge Catheter at skin depth: 15 cm Test dose: negative  Assessment Sensory level: T8 Events: blood not aspirated, injection not painful, no injection resistance, no paresthesia and negative IV test  Additional Notes CSE performed uneventfully with 24G sprotte, clear CSF. Reason for block:procedure for pain

## 2021-05-11 NOTE — Progress Notes (Signed)
Labor Progress Note LANETA GUERIN is a 25 y.o. [phone] at [redacted]w[redacted]d presented for term eIOL.  S: Doing well. Feeling uncomfortable with contractions.  O:  BP (!) 123/95   Pulse 85   Temp (!) 96.8 F (36 C) (Axillary)   Resp 18   Ht 5\' 2"  (1.575 m)   Wt 88 kg   LMP 08/04/2020   BMI 35.46 kg/m  EFM: 125/mod/+accels, no decels  CVE: Dilation: 4 Effacement (%): 70 Station: -2 Presentation: Vertex Exam by:: Dr. [email or web address] (resident)   A&P: 25 y.o. 22 [redacted]w[redacted]d  #Labor: Progressing well. CVE more favorable. Start pitocin 2x2 and titrate to complete. #Pain: Requests epidural #FWB: Cat 1 #GBS negative  [redacted]w[redacted]d, MD 3:15 PM

## 2021-05-11 NOTE — H&P (Addendum)
OBSTETRIC ADMISSION HISTORY AND PHYSICAL  Robyn Stone is a 25 y.o. female G4P2012 with IUP at [redacted]w[redacted]d by LMP presenting for eIOL at term. She reports +FMs, No LOF, no VB, no blurry vision, headaches or peripheral edema, and RUQ pain.  She plans on breast feeding. She requests postpartum Liletta for birth control. She received her prenatal care at [facility]   Dating: By LMP --->  Estimated Date of Delivery: 05/11/21  Sono:    @[redacted]w[redacted]d, CWD, normal anatomy, breech presentation, anterior fundal lie, 591g, 55% EFW  Prenatal History/Complications:  Rubella NI  Past Medical History: Past Medical History:  Diagnosis Date   Anxiety    Headache    Medical history non-contributory    Pre-eclampsia    Pregnancy induced hypertension     Past Surgical History: Past Surgical History:  Procedure Laterality Date   NO PAST SURGERIES      Obstetrical History: OB History     Gravida  4   Para  2   Term  2   Preterm      AB  1   Living  2      SAB  1   IAB      Ectopic      Multiple  0   Live Births  2           Social History Social History   Socioeconomic History   Marital status: Single    Spouse name: Not on file   Number of children: Not on file   Years of education: Not on file   Highest education level: Not on file  Occupational History   Not on file  Tobacco Use   Smoking status: Former    Types: Cigarettes    Quit date: 08/01/2019    Years since quitting: 1.7   Smokeless tobacco: Never   Tobacco comments:    1/2 day  Vaping Use   Vaping Use: Every day  Substance and Sexual Activity   Alcohol use: Not Currently    Alcohol/week: 0.0 standard drinks    Comment: last drink was 09/01/20   Drug use: Yes    Types: Marijuana    Comment: last use 3/22   Sexual activity: Not Currently    Partners: Male    Birth control/protection: None  Other Topics Concern   Not on file  Social History Narrative   Not on file   Social Determinants of Health    Financial Resource Strain: Not on file  Food Insecurity: Not on file  Transportation Needs: Not on file  Physical Activity: Not on file  Stress: Not on file  Social Connections: Not on file    Family History: Family History  Problem Relation Age of Onset   Hypertension Mother    Stroke Father     Allergies: No Known Allergies  Medications Prior to Admission  Medication Sig Dispense Refill Last Dose   acetaminophen (TYLENOL) 500 MG tablet Take 1-2 tablets (500-1,000 mg total) by mouth every 6 (six) hours as needed for moderate pain, fever or headache. (Patient not taking: Reported on 05/01/2021) 60 tablet 2    Blood Pressure Monitoring (BLOOD PRESSURE KIT) DEVI 1 kit by Does not apply route once a week. 1 each 0    Blood Pressure Monitoring (BLOOD PRESSURE MONITOR 3) DEVI Please check blood pressure 1-2 times per week (Patient not taking: Reported on 12/26/2020) 1 each 0    Iron Polysacch Cmplx-B12-FA 150-0.025-1 MG CAPS Take 1 capsule by mouth   every other day. 30 capsule 5    Prenatal Vit-Fe Phos-FA-Omega (VITAFOL GUMMIES) 3.33-0.333-34.8 MG CHEW Chew 1 tablet by mouth daily. 90 tablet 11      Review of Systems   All systems reviewed and negative except as stated in HPI  Height 5' 2" (1.575 m), weight 88 kg, last menstrual period 08/04/2020, not currently breastfeeding. General appearance: alert, cooperative, and no distress Lungs: no increased work of breathing Heart: warm and well perfused Abdomen: soft, non-tender; gravid uterus Extremities: No edema or calf tenderness. Presentation: cephalic Fetal monitoring: 120/min/+accels/no decels Uterine activity: contractions every 5 minutes Dilation: 2.5 Effacement (%): 60 Exam by:: Cecelia Byars RN   Prenatal labs: ABO, Rh: --/--/PENDING (12/10 1100) Antibody: PENDING (12/10 1100) Rubella: <0.90 (05/24 1357) RPR: Non Reactive (09/26 0853)  HBsAg: Negative (05/24 1357)  HIV: Non Reactive (09/26 0853)  GBS:  Negative/-- (11/10 1153)  2 hr Glucola: Normal Genetic screening: LR NIPS, neg AFP, neg Horizon Anatomy US: Normal  Prenatal Transfer Tool  Maternal Diabetes: No Genetic Screening: Normal Maternal Ultrasounds/Referrals: Normal Fetal Ultrasounds or other Referrals:  None Maternal Substance Abuse:  No Significant Maternal Medications:  None Significant Maternal Lab Results: Group B Strep negative  Results for orders placed or performed during the hospital encounter of 05/11/21 (from the past 24 hour(s))  Type and screen [facility]   Collection Time: 05/11/21 11:00 AM  Result Value Ref Range   ABO/RH(D) PENDING    Antibody Screen PENDING    Sample Expiration      05/14/2021,2359 Performed at [facility], [place] [address] [place], [place] [address]     Patient Active Problem List   Diagnosis Date Noted   Indication for care in labor and delivery, antepartum 05/11/2021   Term pregnancy 05/10/2021   Rubella non-immune status, antepartum 10/31/2020   Supervision of normal pregnancy, antepartum 09/11/2020    Assessment/Plan:  Robyn Stone is a 25 y.o. [id number] at 1w0dhere for term eIOL.  #Labor: Begin induction with cytotec and recheck in 3-4 hours. #Pain: PRN #FWB: Cat 1 #ID:  GBS neg #MOF: breast #MOC: postpartum Liletta #Circ:  yes  VKristeen Miss MD  05/11/2021, 11:24 AM  GME ATTESTATION:  I saw and evaluated the patient. I agree with the findings and the plan of care as documented in the resident's note.  SDarrelyn Hillock DO OB Fellow, [facility]12/03/2021 8:57 PM

## 2021-05-11 NOTE — Lactation Note (Signed)
This note was copied from a baby's chart. Lactation Consultation Note Mom didn't need to see Lactation in L&D.  [name] DUKRC'V Date: 05/11/2021   Age:25 hours  Maternal Data    Feeding    LATCH Score                    Lactation Tools Discussed/Used    Interventions    Discharge    Consult Status      Charyl Dancer 05/11/2021, 7:24 PM

## 2021-05-11 NOTE — Discharge Summary (Signed)
Postpartum Discharge Summary  Date of Service updated     [name] DOB: 22-Jul-1995 MRN: [id number]  Date of admission: 05/11/2021 Delivery date:05/11/2021  Delivering provider: Patriciaann Clan  Date of discharge: 05/12/2021  Admitting diagnosis: Indication for care in labor and delivery, antepartum [O75.9] Intrauterine pregnancy: [redacted]w[redacted]d    Secondary diagnosis:  Principal Problem:   Indication for care in labor and delivery, antepartum Active Problems:   Rubella non-immune status, antepartum  Additional problems: N/A    Discharge diagnosis: Term Pregnancy Delivered                                              Post partum procedures: Postplacental IUD placement Augmentation: Pitocin and Cytotec Complications: None  Hospital course: Induction of Labor With Vaginal Delivery   25y.o. yo [id number] admitted to the hospital 05/11/2021 for induction of labor.  Indication for induction: Elective.  Patient had an uncomplicated labor course as follows: Membrane Rupture Time/Date: 5:30 PM ,05/11/2021   Delivery Method:Vaginal, Spontaneous  Episiotomy: None  Lacerations:  Periurethral  Details of delivery can be found in separate delivery note.  Patient had a routine postpartum course. Patient is discharged home 05/12/21.  Newborn Data: Birth date:05/11/2021  Birth time:5:40 PM  Gender:Female  Living status:Living  Apgars:9 ,9  Weight:3062 g   Magnesium Sulfate received: No BMZ received: No Rhophylac:N/A MMZCH:YIFOYDXor postpartum administration T-DaP: Declined prenatally Flu: Declined Transfusion:No  Physical exam  Vitals:   05/11/21 2124 05/12/21 0045 05/12/21 0504 05/12/21 0910  BP: 113/86 (!) 118/59 118/79 110/78  Pulse: (!) 106 79 71 67  Resp: _0 Temp: 98.6 F (37 C) 98.2 F (36.8 C) 98.5 F (36.9 C) 98.8 F (37.1 C)  TempSrc: Oral Oral Oral Oral  SpO2: 100% 98%  100%  Weight:      Height:       General: alert,  cooperative, and no distress Lochia: appropriate Uterine Fundus: firm Incision: N/A DVT Evaluation: No significant calf/ankle edema. Labs: Lab Results  Component Value Date   WBC 10.5 05/11/2021   HGB 10.1 (L) 05/11/2021   HCT 32.1 (L) 05/11/2021   MCV 74.3 (L) 05/11/2021   PLT 251 05/11/2021   CMP Latest Ref Rng & Units 06/28/2020  Glucose 65 - 99 mg/dL 85  BUN 7 - 25 mg/dL 9  Creatinine 0.50 - 1.10 mg/dL 0.50  Sodium 135 - 146 mmol/L 139  Potassium 3.5 - 5.3 mmol/L 4.3  Chloride 98 - 110 mmol/L 107  CO2 20 - 32 mmol/L 23  Calcium 8.6 - 10.2 mg/dL 9.1  Total Protein 6.1 - 8.1 g/dL 7.1  Total Bilirubin 0.2 - 1.2 mg/dL 0.5  Alkaline Phos 38 - 126 U/L -  AST 10 - 30 U/L 14  ALT 6 - 29 U/L 14   Edinburgh Score: Edinburgh Postnatal Depression Scale Screening Tool 05/12/2021  I have been able to laugh and see the funny side of things. (No Data)  I have looked forward with enjoyment to things. -  I have blamed myself unnecessarily when things went wrong. -  I have been anxious or worried for no good reason. -  I have felt scared or panicky for no good reason. -  Things have been getting on top of me. -  I have been so unhappy  that I have had difficulty sleeping. -  I have felt sad or miserable. -  I have been so unhappy that I have been crying. -  The thought of harming myself has occurred to me. Flavia Shipper Postnatal Depression Scale Total -     After visit meds:  Allergies as of 05/12/2021   No Known Allergies      Medication List     STOP taking these medications    Blood Pressure Kit Devi   Blood Pressure Monitor 3 Devi       TAKE these medications    acetaminophen 500 MG tablet Commonly known as: TYLENOL Take 1-2 tablets (500-1,000 mg total) by mouth every 6 (six) hours as needed for moderate pain, fever or headache.   ibuprofen 600 MG tablet Commonly known as: ADVIL Take 1 tablet (600 mg total) by mouth every 6 (six) hours as needed.   Iron  Polysacch Cmplx-B12-FA 150-0.025-1 MG Caps Take 1 capsule by mouth every other day.   Vitafol Gummies 3.33-0.333-34.8 MG Chew Chew 1 tablet by mouth daily.         Discharge home in stable condition Infant Feeding: Breast Infant Disposition:home with mother Discharge instruction: per After Visit Summary and Postpartum booklet. Activity: Advance as tolerated. Pelvic rest for 6 weeks.  Diet: routine diet Future Appointments:No future appointments. Follow up Visit:  Message sent by Maryelizabeth Kaufmann, CNM on 05/11/2021  Please schedule this patient for a In person postpartum visit in 4 weeks with the following provider: Any provider. Additional Postpartum F/U: IUD string check   Low risk pregnancy complicated by:  N/A Delivery mode:  Vaginal, Spontaneous  Anticipated Birth Control:  IUD at postpartum visit    05/12/2021 Patriciaann Clan, DO

## 2021-05-11 NOTE — Anesthesia Preprocedure Evaluation (Addendum)
Anesthesia Evaluation  Patient identified by MRN, date of birth, ID band Patient awake    Reviewed: Allergy & Precautions, Patient's Chart, lab work & pertinent test results  Airway Mallampati: II  TM Distance: >3 FB Neck ROM: Full    Dental no notable dental hx.    Pulmonary neg pulmonary ROS, former smoker,    Pulmonary exam normal breath sounds clear to auscultation       Cardiovascular hypertension (PIH), Normal cardiovascular exam Rhythm:Regular Rate:Normal     Neuro/Psych  Headaches, PSYCHIATRIC DISORDERS Anxiety    GI/Hepatic negative GI ROS, (+)       marijuana use,   Endo/Other  BMI 35  Renal/GU negative Renal ROS  negative genitourinary   Musculoskeletal negative musculoskeletal ROS (+)   Abdominal (+) + obese,   Peds negative pediatric ROS (+)  Hematology negative hematology ROS (+)   Anesthesia Other Findings   Reproductive/Obstetrics (+) Pregnancy                             Anesthesia Physical Anesthesia Plan  ASA: 2  Anesthesia Plan: Epidural   Post-op Pain Management:    Induction:   PONV Risk Score and Plan: 2  Airway Management Planned: Natural Airway  Additional Equipment: None  Intra-op Plan:   Post-operative Plan:   Informed Consent: I have reviewed the patients History and Physical, chart, labs and discussed the procedure including the risks, benefits and alternatives for the proposed anesthesia with the patient or authorized representative who has indicated his/her understanding and acceptance.       Plan Discussed with:   Anesthesia Plan Comments: (8cm, starting to feel sensation to push- d/w pt the risks and alternatives to epidural, still requesting neuraxial. Will perform CSE, suspect imminent delivery)       Anesthesia Quick Evaluation

## 2021-05-11 NOTE — Discharge Instructions (Signed)

## 2021-05-12 LAB — RPR: RPR Ser Ql: NONREACTIVE

## 2021-05-12 MED ORDER — IBUPROFEN 600 MG PO TABS: 600.0000 mg | ORAL_TABLET | Freq: Four times a day (QID) | ORAL | 0 refills | Status: AC | PRN

## 2021-05-12 MED ORDER — MEASLES, MUMPS & RUBELLA VAC IJ SOLR
0.5000 mL | Freq: Once | INTRAMUSCULAR | Status: AC
  Administered 2021-05-12: 0.5 mL via SUBCUTANEOUS
  Filled 2021-05-12: qty 0.5

## 2021-05-12 MED ORDER — INFLUENZA VAC SPLIT QUAD 0.5 ML IM SUSY
0.5000 mL | PREFILLED_SYRINGE | INTRAMUSCULAR | Status: AC
  Administered 2021-05-12: 0.5 mL via INTRAMUSCULAR
  Filled 2021-05-12: qty 0.5

## 2021-05-12 NOTE — Progress Notes (Signed)
CSW met with MOB to complete consult for mental health. CSW observed MOB resting in bed, bonding with infant. CSW explained role, and reason for consult. MOB was pleasant, and polite during engagement with CSW. MOB reported, history of anxiety, and postpartum depression with her previous child (2021). MOB reported, she experience feeling anxious, irritable, crying, worrying, over thinking, racing thoughts, and isolation. MOB reported, history of Zoloft, and she stopped taking medication within a month 1/2. MOB reported, she has been able to manage symptoms without medication. CSW encourage MOB to implement healthy coping skills when symptoms arises.   CSW provided education regarding the baby blues period vs. perinatal mood disorders, discussed treatment and gave resources for mental health follow up if concerns arise. CSW recommends self- evaluation during the postpartum time period using the New Mom Checklist from Postpartum Progress and encouraged MOB to contact a medical professional if symptoms are noted at any time.   MOB reported, since delivery she feels, "relieved". MOB reported, her brother is very supportive. MOB denied SI, HI, and DV when CSW assessed for safety.   MOB reported, there are no transportation barriers to follow up infant's care. MOB reported, she has all essentials needed to care for infant. MOB reported, infant has a car seat, and bassinet. MOB denied any additional barriers.     CSW provided education on sudden infant death syndrome (SIDS).  CSW identifies no further need for intervention or barriers to discharge at this time.  Darcus Austin, MSW, LCSW-A Clinical Social Worker- Weekends [phone]

## 2021-05-12 NOTE — Anesthesia Postprocedure Evaluation (Signed)
Anesthesia Post Note  Patient: Robyn Stone  Procedure(s) Performed: AN AD HOC LABOR EPIDURAL     Patient location during evaluation: Mother Baby Anesthesia Type: Epidural Level of consciousness: awake and alert Pain management: pain level controlled Vital Signs Assessment: post-procedure vital signs reviewed and stable Respiratory status: spontaneous breathing and respiratory function stable Cardiovascular status: stable Postop Assessment: no headache, no backache, epidural receding, no apparent nausea or vomiting and able to ambulate Anesthetic complications: no   No notable events documented.  Last Vitals:  Vitals:   05/12/21 0045 05/12/21 0504  BP: (!) 118/59 118/79  Pulse: 79 71  Resp: 18 18  Temp: 36.8 C 36.9 C  SpO2: 98%     Last Pain:  Vitals:   05/12/21 0910  TempSrc:   PainSc: 2    Pain Goal:                   Marquette Saa

## 2021-05-13 ENCOUNTER — Telehealth: Payer: Self-pay

## 2021-05-13 NOTE — Telephone Encounter (Signed)
Transition Care Management Unsuccessful Follow-up Telephone Call  Date of discharge and from where:  05/12/2021-[facility]   Attempts:  1st Attempt  Reason for unsuccessful TCM follow-up call:  Left voice message

## 2021-05-13 NOTE — Discharge Summary (Signed)
error 

## 2021-05-14 NOTE — Telephone Encounter (Signed)
Transition Care Management Unsuccessful Follow-up Telephone Call  Date of discharge and from where:  05/12/2021 from [facility]  Attempts:  2nd Attempt  Reason for unsuccessful TCM follow-up call:  Left voice message

## 2021-05-15 NOTE — Telephone Encounter (Signed)
Transition Care Management Follow-up Telephone Call Date of discharge and from where: 12/11/202-[facility] How have you been since you were released from the hospital? Patient stated she is doing fine.  Any questions or concerns? No  Items Reviewed: Did the pt receive and understand the discharge instructions provided? Yes  Medications obtained and verified? Yes  Other? No  Any new allergies since your discharge? No  Dietary orders reviewed? No Do you have support at home? Yes   Home Care and Equipment/Supplies: Were home health services ordered? not applicable If so, what is the name of the agency? N/A  Has the agency set up a time to come to the patient's home? not applicable Were any new equipment or medical supplies ordered?  No What is the name of the medical supply agency? N/A Were you able to get the supplies/equipment? not applicable Do you have any questions related to the use of the equipment or supplies? No  Functional Questionnaire: (I = Independent and D = Dependent) ADLs: I  Bathing/Dressing- I  Meal Prep- I  Eating- I  Maintaining continence- I  Transferring/Ambulation- I  Managing Meds- I  Follow up appointments reviewed:  PCP Hospital f/u appt confirmed? No   Specialist Hospital f/u appt confirmed? Yes  Scheduled to see OBGYN on 06/29/2021 @ 10:55AM. Are transportation arrangements needed? No  If their condition worsens, is the pt aware to call PCP or go to the Emergency Dept.? Yes Was the patient provided with contact information for the PCP's office or ED? Yes Was to pt encouraged to call back with questions or concerns? Yes

## 2021-05-24 ENCOUNTER — Telehealth (HOSPITAL_COMMUNITY): Payer: Self-pay | Admitting: *Deleted

## 2021-05-24 ENCOUNTER — Encounter: Payer: Self-pay | Admitting: Certified Nurse Midwife

## 2021-05-24 NOTE — Telephone Encounter (Signed)
Attempted Hospital Discharge Follow-Up Call.  Left voice mail requesting that patient return RN's phone call.  

## 2021-05-28 ENCOUNTER — Telehealth: Payer: Self-pay | Admitting: Lactation Services

## 2021-05-28 NOTE — Telephone Encounter (Signed)
Attempted to call patient with concerns about breast feeding and sore breasts. Received message that call could not be completed at this time. Will reply to My Chart message.

## 2021-05-29 ENCOUNTER — Telehealth: Payer: Self-pay | Admitting: Lactation Services

## 2021-05-29 NOTE — Telephone Encounter (Signed)
Called patient to discuss concerns about Lactation. Will respond to My Chart message and call again later.

## 2021-05-30 NOTE — Telephone Encounter (Signed)
Called patient to discuss Lactation Concerns. She did not answer. LM for her to call the office at [phone] if she still has questions or concerns regarding Lactation.

## 2021-06-02 DIAGNOSIS — Z419 Encounter for procedure for purposes other than remedying health state, unspecified: Secondary | ICD-10-CM | POA: Diagnosis not present

## 2021-06-19 ENCOUNTER — Ambulatory Visit: Payer: Medicaid Other | Admitting: Family Medicine

## 2021-07-03 DIAGNOSIS — Z419 Encounter for procedure for purposes other than remedying health state, unspecified: Secondary | ICD-10-CM | POA: Diagnosis not present

## 2021-07-17 ENCOUNTER — Other Ambulatory Visit: Payer: Self-pay

## 2021-07-17 ENCOUNTER — Other Ambulatory Visit (HOSPITAL_COMMUNITY)
Admission: RE | Admit: 2021-07-17 | Discharge: 2021-07-17 | Disposition: A | Discharge status: Home / Self Care | Payer: Medicaid Other | Source: Ambulatory Visit | Attending: Family Medicine | Admitting: Family Medicine

## 2021-07-17 ENCOUNTER — Encounter: Payer: Self-pay | Admitting: Obstetrics

## 2021-07-17 ENCOUNTER — Ambulatory Visit (INDEPENDENT_AMBULATORY_CARE_PROVIDER_SITE_OTHER): Payer: Medicaid Other | Admitting: Obstetrics

## 2021-07-17 DIAGNOSIS — Z3009 Encounter for other general counseling and advice on contraception: Secondary | ICD-10-CM

## 2021-07-17 DIAGNOSIS — Z30430 Encounter for insertion of intrauterine contraceptive device: Secondary | ICD-10-CM | POA: Diagnosis not present

## 2021-07-17 LAB — POCT URINE PREGNANCY: Preg Test, Ur: NEGATIVE

## 2021-07-17 MED ORDER — LEVONORGESTREL 20 MCG/DAY IU IUD
1.0000 | INTRAUTERINE_SYSTEM | Freq: Once | INTRAUTERINE | Status: AC
  Administered 2021-07-17: 1 via INTRAUTERINE

## 2021-07-17 NOTE — Progress Notes (Signed)
Established Patient Office Visit  Subjective:  Patient ID: Robyn Stone, female    DOB: May 11, 1996  Age: 26 y.o. MRN: [id number]  CC:  Chief Complaint  Patient presents with   Postpartum Care    HPI Robyn Stone presents for postpartum visit after vaginal delivery.  She is 9 weeks PP.  Wants a IUD.  Past Medical History:  Diagnosis Date   Anxiety    Headache    Medical history non-contributory    Pre-eclampsia    Pregnancy induced hypertension     Past Surgical History:  Procedure Laterality Date   NO PAST SURGERIES      Family History  Problem Relation Age of Onset   Hypertension Mother    Stroke Father     Social History   Socioeconomic History   Marital status: Single    Spouse name: Not on file   Number of children: Not on file   Years of education: Not on file   Highest education level: Not on file  Occupational History   Not on file  Tobacco Use   Smoking status: Former    Types: Cigarettes    Quit date: 08/01/2019    Years since quitting: 1.9   Smokeless tobacco: Never   Tobacco comments:    1/2 day  Vaping Use   Vaping Use: Every day  Substance and Sexual Activity   Alcohol use: Yes    Comment: Every other weekend   Drug use: Not Currently    Types: Marijuana    Comment: last use 3/22   Sexual activity: Yes    Partners: Male    Birth control/protection: Condom  Other Topics Concern   Not on file  Social History Narrative   Not on file   Social Determinants of Health   Financial Resource Strain: Not on file  Food Insecurity: Not on file  Transportation Needs: Not on file  Physical Activity: Not on file  Stress: Not on file  Social Connections: Not on file  Intimate Partner Violence: Not on file    Outpatient Medications Prior to Visit  Medication Sig Dispense Refill   acetaminophen (TYLENOL) 500 MG tablet Take 1-2 tablets (500-1,000 mg total) by mouth every 6 (six) hours as needed for moderate pain, fever or headache.  60 tablet 2   ibuprofen (ADVIL) 600 MG tablet Take 1 tablet (600 mg total) by mouth every 6 (six) hours as needed. 30 tablet 0   Iron Polysacch Cmplx-B12-FA 150-0.025-1 MG CAPS Take 1 capsule by mouth every other day. (Patient not taking: Reported on 07/17/2021) 30 capsule 5   Prenatal Vit-Fe Phos-FA-Omega (VITAFOL GUMMIES) 3.33-0.333-34.8 MG CHEW Chew 1 tablet by mouth daily. 90 tablet 11   No facility-administered medications prior to visit.    No Known Allergies  ROS Review of Systems  Constitutional: Negative.   Eyes: Negative.   Endocrine: Negative.   Negative   Objective:    Physical Exam:  WNL's  BP 114/78    Pulse 91    Ht 5\' 2"  (1.575 m)    Wt 179 lb 3.2 oz (81.3 kg)    Breastfeeding No    BMI 32.78 kg/m  Wt Readings from Last 3 Encounters:  07/17/21 179 lb 3.2 oz (81.3 kg)  05/11/21 193 lb 14.4 oz (88 kg)  05/10/21 195 lb (88.5 kg)     Health Maintenance Due  Topic Date Due   COVID-19 Vaccine (1) Never done   HPV VACCINES (1 - 2-dose series)  Never done       Topic Date Due   HPV VACCINES (1 - 2-dose series) Never done    Lab Results  Component Value Date   TSH 1.001 03/08/2014   Lab Results  Component Value Date   WBC 10.5 05/11/2021   HGB 10.1 (L) 05/11/2021   HCT 32.1 (L) 05/11/2021   MCV 74.3 (L) 05/11/2021   PLT 251 05/11/2021   Lab Results  Component Value Date   NA 139 06/28/2020   K 4.3 06/28/2020   CO2 23 06/28/2020   GLUCOSE 85 06/28/2020   BUN 9 06/28/2020   CREATININE 0.50 06/28/2020   BILITOT 0.5 06/28/2020   ALKPHOS 195 (H) 01/03/2020   AST 14 06/28/2020   ALT 14 06/28/2020   PROT 7.1 06/28/2020   ALBUMIN 2.5 (L) 01/03/2020   CALCIUM 9.1 06/28/2020   ANIONGAP 9 01/03/2020   Lab Results  Component Value Date   CHOL 158 06/28/2020   Lab Results  Component Value Date   HDL 38 (L) 06/28/2020   Lab Results  Component Value Date   LDLCALC 97 06/28/2020   Lab Results  Component Value Date   TRIG 125 06/28/2020   Lab  Results  Component Value Date   CHOLHDL 4.2 06/28/2020   Lab Results  Component Value Date   HGBA1C 5.1 07/13/2019      Assessment & Plan:   Problem List Items Addressed This Visit   None Visit Diagnoses     Postpartum care following vaginal delivery    -  Primary   Encounter for other general counseling and advice on contraception       Encounter for insertion of mirena IUD       Relevant Medications   levonorgestrel (MIRENA) 20 MCG/DAY IUD 1 each (Completed)   Other Relevant Orders   POCT urine pregnancy (Completed)   Cervicovaginal ancillary only( [place])       Meds ordered this encounter  Medications   levonorgestrel (MIRENA) 20 MCG/DAY IUD 1 each    Follow-up: Return in about 6 weeks (around 08/28/2021).    Baltazar Najjar, MD 07/17/21   IUD Insertion Procedure Note  Pre-operative Diagnosis: Desire contraception  Post-operative Diagnosis: same  Indications: contraception  Procedure Details  Urine pregnancy test was done in office and result was negative.  The risks (including infection, bleeding, pain, and uterine perforation) and benefits of the procedure were explained to the patient and Written informed consent was obtained.    Cervix cleansed with Betadine. Uterus sounded to 6 cm. IUD inserted without difficulty. String visible and trimmed. Patient tolerated procedure well.  IUD Information: Mirena, Lot #: [id number], Expiration date: 07-31-2023.  Condition: Stable  Complications: None  Plan:  The patient was advised to call for any fever or for prolonged or severe pain or bleeding. She was advised to use NSAID as needed for mild to moderate pain.   Attending Physician Documentation: I was present for or participated in the entire procedure, including opening and closing.    Shelly Bombard, MD 07/17/2021 3:04 PM

## 2021-07-18 LAB — CERVICOVAGINAL ANCILLARY ONLY
Chlamydia: NEGATIVE
Comment: NEGATIVE
Comment: NORMAL
Neisseria Gonorrhea: NEGATIVE

## 2021-07-19 NOTE — Progress Notes (Signed)
° ° °  [place] Partum Visit Note  Robyn Stone is a 26 y.o. [phone] female who presents for a postpartum visit. She is 8 weeks postpartum following a normal spontaneous vaginal delivery.  I have fully reviewed the prenatal and intrapartum course. The delivery was at 40 gestational weeks.  Anesthesia: epidural. Postpartum course has been uncomplicated. Baby is doing well. Baby is feeding by bottle - Gerber Gentle . Bleeding no bleeding. Bowel function is normal. Bladder function is normal. Patient is sexually active. Contraception method is IUD. Postpartum depression screening: negative.   The pregnancy intention screening data noted above was reviewed. Potential methods of contraception were discussed. The patient elected to proceed with No data recorded.   Edinburgh Postnatal Depression Scale - 07/17/21 1112       Edinburgh Postnatal Depression Scale:  In the Past 7 Days   I have been able to laugh and see the funny side of things. 0    I have looked forward with enjoyment to things. 0    I have blamed myself unnecessarily when things went wrong. 1    I have been anxious or worried for no good reason. 0    I have felt scared or panicky for no good reason. 0    Things have been getting on top of me. 0    I have been so unhappy that I have had difficulty sleeping. 0    I have felt sad or miserable. 0    I have been so unhappy that I have been crying. 0    The thought of harming myself has occurred to me. 0    Edinburgh Postnatal Depression Scale Total 1             Health Maintenance Due  Topic Date Due   COVID-19 Vaccine (1) Never done   HPV VACCINES (1 - 2-dose series) Never done

## 2021-07-31 ENCOUNTER — Ambulatory Visit: Payer: Medicaid Other | Admitting: Obstetrics

## 2021-07-31 DIAGNOSIS — Z419 Encounter for procedure for purposes other than remedying health state, unspecified: Secondary | ICD-10-CM | POA: Diagnosis not present

## 2021-08-04 IMAGING — US US MFM OB FOLLOW-UP
1 series · 14 of 28 positions shown · non-contrast
Comparison: none

[Series 1: us mfm ob follow-up · 34 acquisitions, 14 frames shown]
[im 2/34]
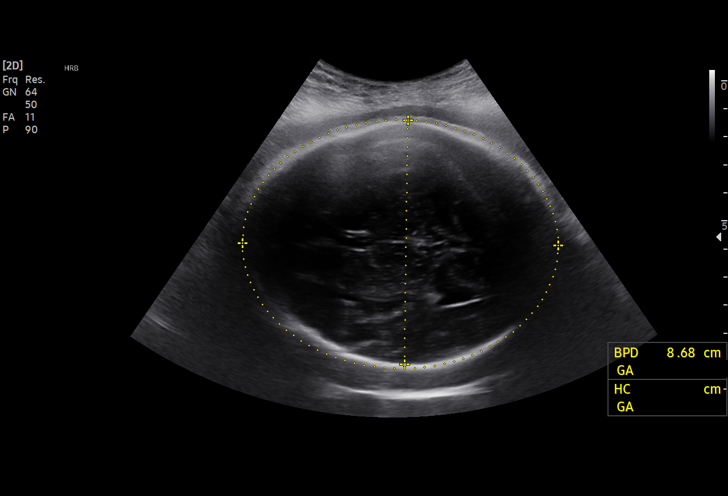
[im 4/34]
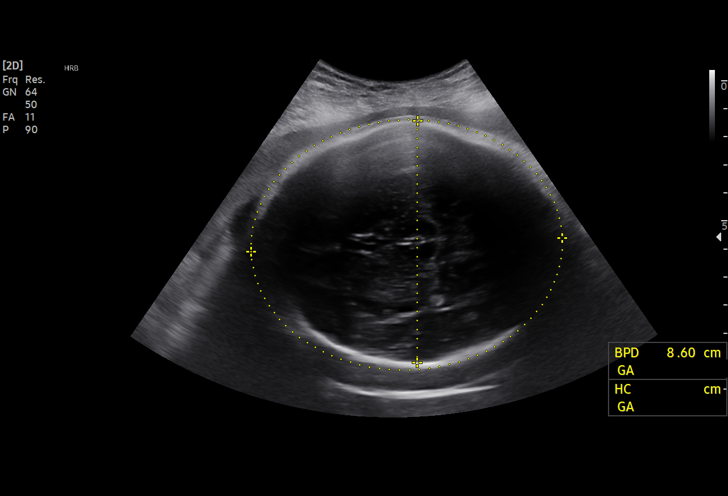
[im 7/34]
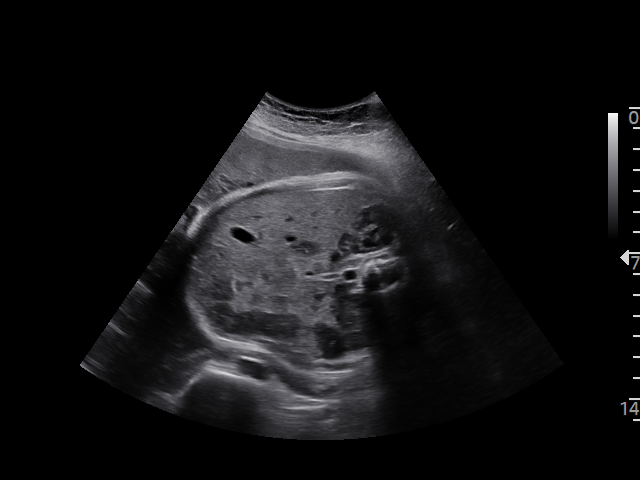
[im 9/34]
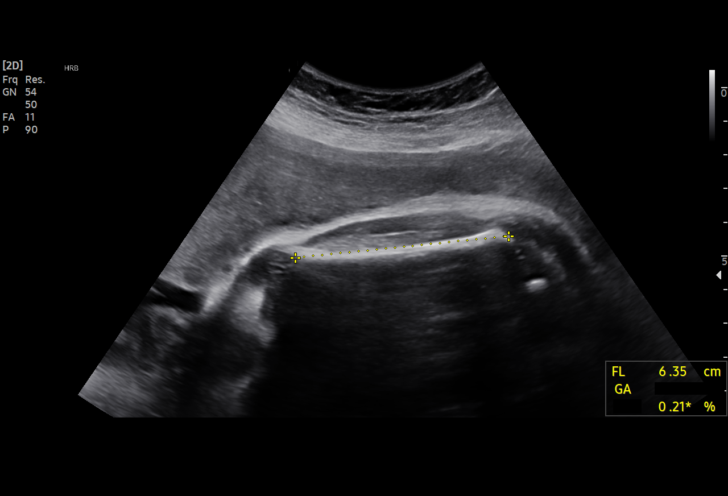
[im 12/34]
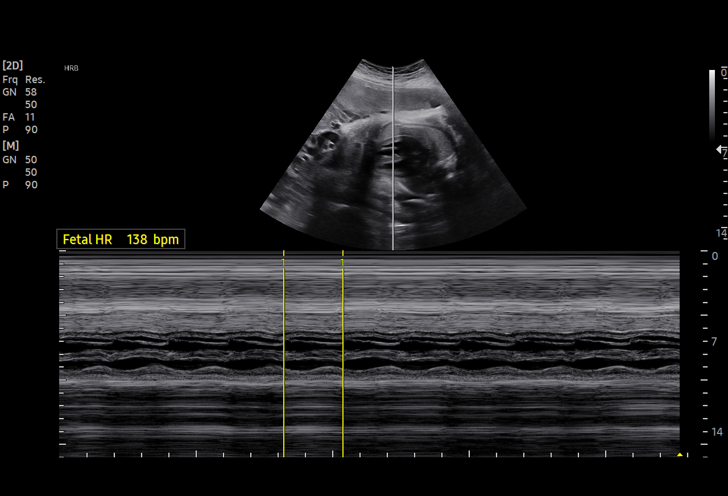
[im 14/34]
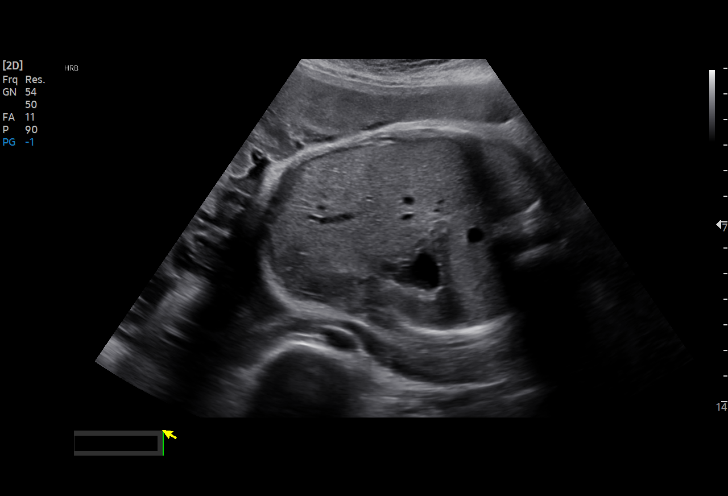
[im 16/34]
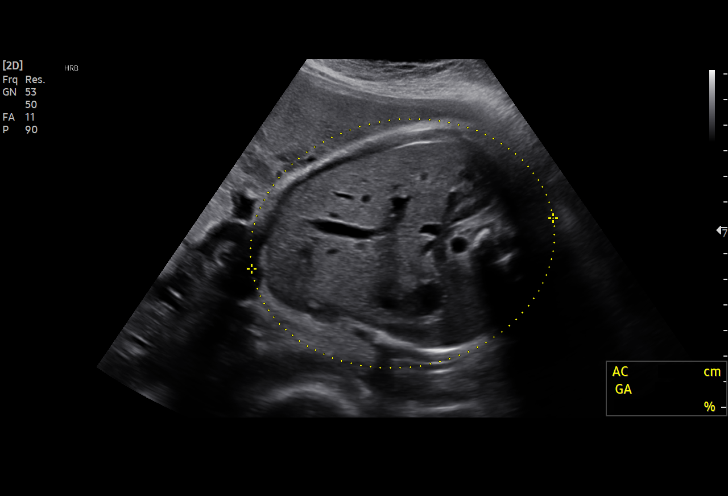
[im 19/34]
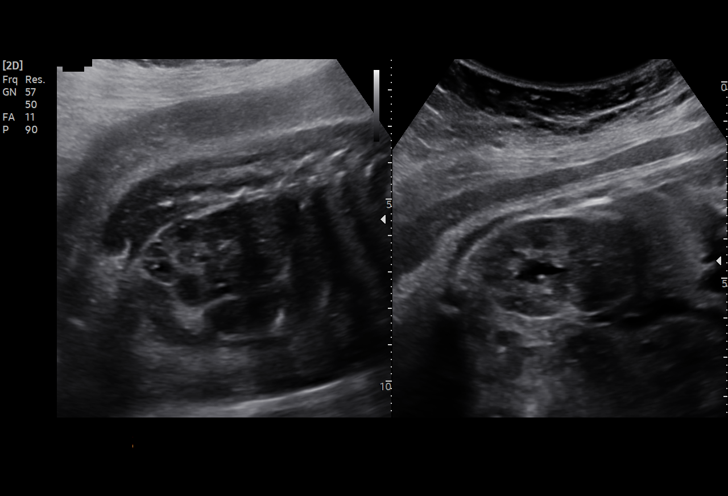
[im 21/34]
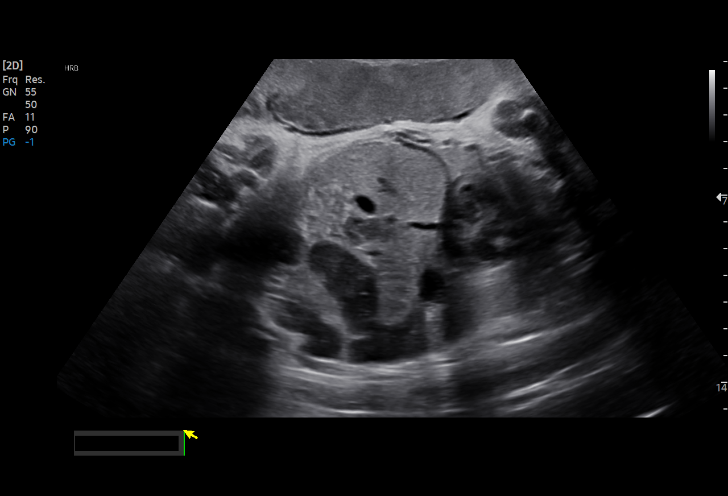
[im 24/34]
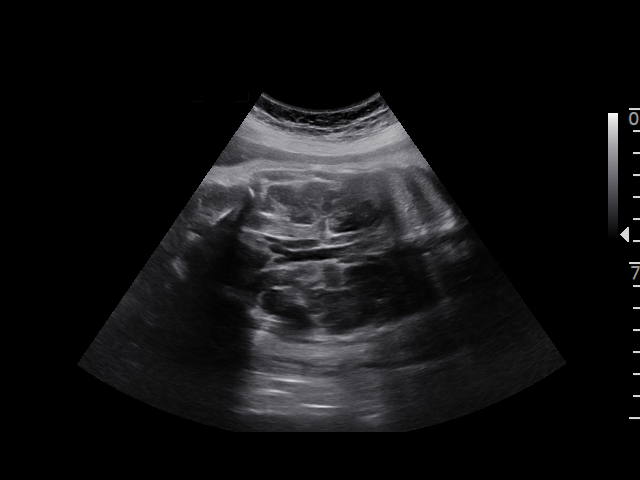
[im 26/34]
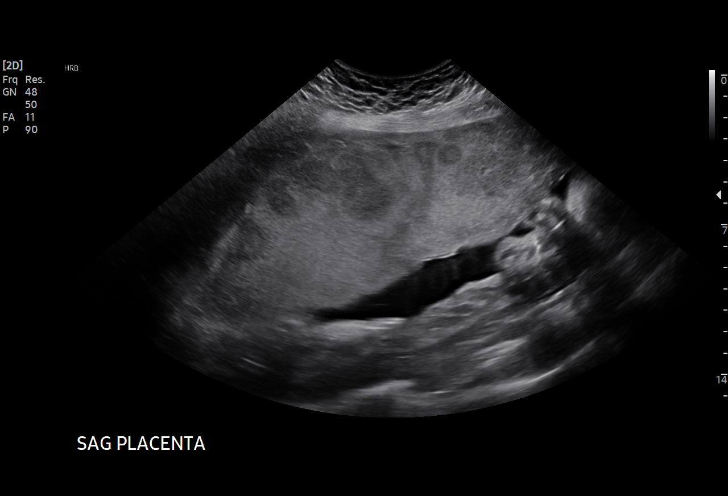
[im 29/34]
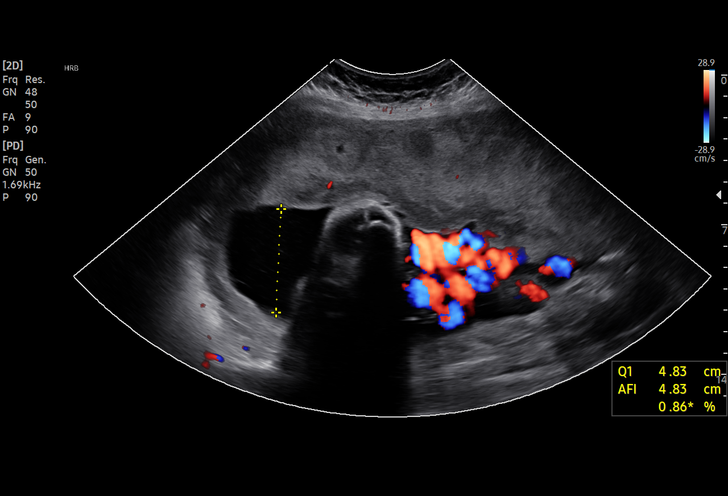
[im 31/34]
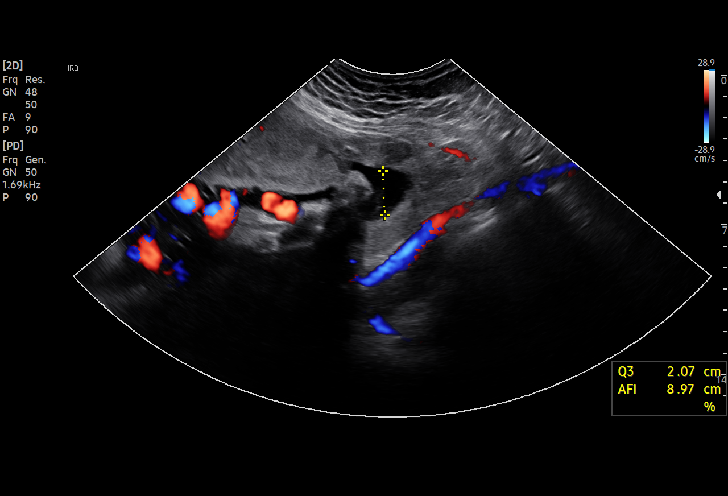
[im 34/34]
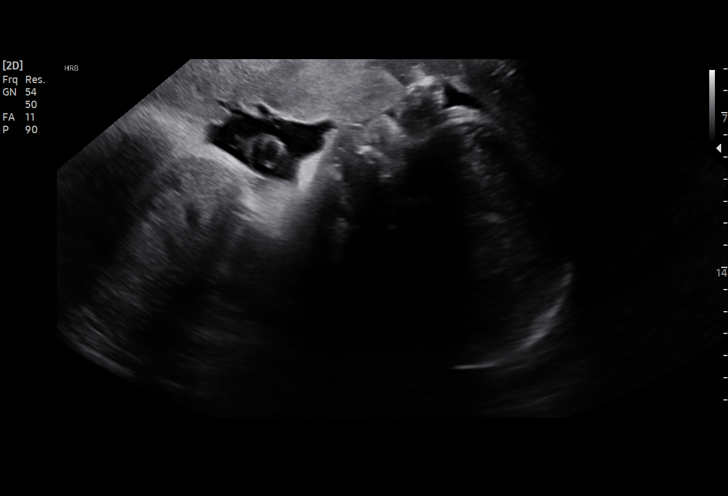

[14 of 28 positions shown; findings below may reference images not displayed]

POPPA NP

Indications

 Size-Date Discrepancy (low fundal height)
 Encounter for other antenatal screening
 follow-up (LR NIPS)
 36 weeks gestation of pregnancy
 Poor obstetric history: Previous
 preeclampsia / eclampsia/gestational HTN
Fetal Evaluation

 Num Of Fetuses:         1
 Fetal Heart Rate(bpm):  138
 Cardiac Activity:       Observed
 Presentation:           Cephalic
 Placenta:               Anterior
 P. Cord Insertion:      Previously Visualized

 Amniotic Fluid
 AFI FV:      Within normal limits

 AFI Sum(cm)     %Tile       Largest Pocket(cm)
 8.97            16

 RUQ(cm)       RLQ(cm)       LUQ(cm)        LLQ(cm)
 4.83          0
Biometry
 BPD:      86.4  mm     G. Age:  34w 6d         13  %    CI:        75.21   %    70 - 86
                                                         FL/HC:      19.9   %    20.8 -
 HC:       316   mm     G. Age:  35w 3d        4.8  %    HC/AC:      0.93        0.92 -
 AC:      339.6  mm     G. Age:  37w 6d         86  %    FL/BPD:     72.7   %    71 - 87
 FL:       62.8  mm     G. Age:  32w 4d        < 1  %    FL/AC:      18.5   %    20 - 24

 LV:        5.5  mm

 Est. FW:    0505  gm      6 lb 3 oz     32  %
OB History

 Gravidity:    3         Term:   1         SAB:   1
 Living:       1
Gestational Age

 LMP:           36w 6d        Date:  04/16/19                 EDD:   01/21/20
 U/S Today:     35w 1d                                        EDD:   02/02/20
 Best:          36w 6d     Det. By:  LMP  (04/16/19)          EDD:   01/21/20
Anatomy

 Cranium:               Appears normal         Aortic Arch:            Previously seen
 Cavum:                 Appears normal         Ductal Arch:            Previously seen
 Ventricles:            Appears normal         Diaphragm:              Previously seen
 Choroid Plexus:        Previously seen        Stomach:                Appears normal, left
                                                                       sided
 Cerebellum:            Previously seen        Abdomen:                Appears normal
 Posterior Fossa:       Previously seen        Abdominal Wall:         Previously seen
 Nuchal Fold:           Not applicable (>20    Cord Vessels:           Previously seen
                        wks GA)
 Face:                  Orbits and profile     Kidneys:                Appear normal
                        previously seen
 Lips:                  Previously seen        Bladder:                Appears normal
 Thoracic:              Appears normal         Spine:                  Previously seen
 Heart:                 Appears normal         Upper Extremities:      Previously seen
                        (4CH, axis, and
                        situs)
 RVOT:                  Previously seen        Lower Extremities:      Previously seen
 LVOT:                  Previously seen

 Other:  Male gender. Nasal bone previously visualized. Technically difficult
         due to fetal position.
Cervix Uterus Adnexa

 Cervix
 Not visualized (advanced GA >36wks)
Comments

 This patient was seen for a follow up growth scan as her
 fundal heights have been measuring less than her dates.
 The fetal growth and amniotic fluid level appears appropriate
 for her gestational age.
 Follow-up as indicated.

## 2021-08-07 ENCOUNTER — Telehealth: Payer: Self-pay

## 2021-08-07 ENCOUNTER — Ambulatory Visit: Payer: Medicaid Other | Admitting: Obstetrics

## 2021-08-07 NOTE — Telephone Encounter (Signed)
.. ?  Medicaid Managed Care  ? ?Unsuccessful Outreach Note ? ?08/07/2021 ?Name: Robyn Stone MRN: [id number] DOB: Apr 06, 1996 ? ?Referred by: Pa, [facility] ?Reason for referral : High Risk Managed Medicaid (I called the patient today to get her scheduled with the MM Team. I left my name and number on her VM.) ? ? ?An unsuccessful telephone outreach was attempted today. The patient was referred to the case management team for assistance with care management and care coordination.  ? ?Follow Up Plan: The care management team will reach out to the patient again over the next 14 days.  ? ?Weston Settle ?Care Guide, High Risk Medicaid Managed Care ?Embedded Care Coordination ?[place]  [facility]  ? ? ?  ?

## 2021-08-21 ENCOUNTER — Other Ambulatory Visit: Payer: Self-pay

## 2021-08-21 ENCOUNTER — Other Ambulatory Visit: Payer: Self-pay | Admitting: Obstetrics and Gynecology

## 2021-08-21 NOTE — Patient Outreach (Signed)
?Medicaid Managed Care   ?Nurse Care Manager Note ? ?08/21/2021 ?Name:  Robyn Stone MRN:  [id number] DOB:  09-11-1995 ? ?Robyn Stone is an 26 y.o. year old female who is a primary patient of Pa, [facility].  The [facility] Managed Care Coordination team was consulted for assistance with:    ?Healthcare management needs, irregular vaginal bleeding ? ?Robyn Stone was given information about Medicaid Managed Care Coordination team services today. [name] agreed to services and verbal consent obtained. ? ?Engaged with patient by telephone for initial visit in response to provider referral for case management and/or care coordination services.  ? ?Assessments/Interventions:  Review of past medical history, allergies, medications, health status, including review of consultants reports, laboratory and other test data, was performed as part of comprehensive evaluation and provision of chronic care management services. ? ?SDOH (Social Determinants of Health) assessments and interventions performed: ?SDOH Interventions   ? ?Flowsheet Row Most Recent Value  ?SDOH Interventions   ?Intimate Partner Violence Interventions Intervention Not Indicated  ? ?  ? ?Care Plan ? ?No Known Allergies ? ?Medications Reviewed Today   ? ? Reviewed by Robyn Chandler, RN (Registered Nurse) on 08/21/21 at 1314  Med List Status: <None>  ? ?Medication Order Taking? Sig Documenting Provider Last Dose Status Informant  ?acetaminophen (TYLENOL) 500 MG tablet [id number] Yes Take 1-2 tablets (500-1,000 mg total) by mouth every 6 (six) hours as needed for moderate pain, fever or headache. Gerrit Heck, CNM Taking Active   ?ibuprofen (ADVIL) 600 MG tablet [id number] Yes Take 1 tablet (600 mg total) by mouth every 6 (six) hours as needed. Allayne Stack, DO Taking Active   ? ?  ?  ? ?  ? ?Patient Active Problem List  ? Diagnosis Date Noted  ? Indication for care in labor and delivery, antepartum 05/11/2021  ? Term  pregnancy 05/10/2021  ? Rubella non-immune status, antepartum 10/31/2020  ? Supervision of normal pregnancy, antepartum 09/11/2020  ? ?Conditions to be addressed/monitored per PCP order:  Healthcare management needs, irregular vaginal bleeding ? ?Care Plan : RN Care Manager Plan of Care  ?Updates made by Robyn Chandler, RN since 08/21/2021 12:00 AM  ?  ? ?Problem: Care Management and Care Coordiantion   ?Priority: High  ?  ? ?Long-Range Goal: Self-Management Plan Developed   ?Start Date: 08/21/2021  ?Expected End Date: 11/21/2021  ?This Visit's Progress: On track  ?Priority: Medium  ?Note:   ?Current Barriers:  ?Knowledge Deficits related to plan of care for management of irregular vaginal bleeding with new IUD-placed approximately one month ago  ? ?RNCM Clinical Goal(s):  ?Patient will verbalize understanding of plan for management of irregular vaginal bleeding as evidenced by patient report ?attend all scheduled medical appointments as evidenced by patient report ?continue to work with RN Care Manager to address care management and care coordination needs related to  irregular vaginal bleeding as evidenced by adherence to CM Team Scheduled appointments through collaboration with RN Care manager, provider, and care team.  ? ?Interventions: ?Inter-disciplinary care team collaboration (see longitudinal plan of care) ?Evaluation of current treatment plan related to  self management and patient's adherence to plan as established by provider ? ?  (Status:  New goal.)  Long Term Goal ?Evaluation of current treatment plan related to  irregular vaginal bleeding , self-management and patient's adherence to plan as established by provider. ?Discussed plans with patient for ongoing care management follow up and provided patient with direct contact  information for care management team ?Advised patient to contact provider regarding irregular vaginal bleeding and refill of iron prescription. ?Reviewed medications with  patient ?Reviewed scheduled/upcoming provider appointments ?Discussed plans with patient for ongoing care management follow up and provided patient with direct contact information for care management team ? ?Patient Goals/Self-Care Activities: ?Take all medications as prescribed ?Attend all scheduled provider appointments ?Call pharmacy for medication refills 3-7 days in advance of running out of medications ?Perform all self care activities independently  ?Perform IADL's (shopping, preparing meals, housekeeping, managing finances) independently ?Call provider office for new concerns or questions  ? ?Follow Up Plan:  The patient has been provided with contact information for the care management team and has been advised to call with any health related questions or concerns.  ?The care management team will reach out to the patient again over the next 30 days.  ? ?Long-Range Goal: Establish Plan of Care for Care Management Needs   ?Start Date: 08/21/2021  ?Expected End Date: 11/21/2021  ?Priority: High  ?Note:   ?Timeframe:  Long-Range Goal ?Priority:  High ?Start Date:      08/21/21                       ?Expected End Date:   ongoing                   ? ?Follow Up Date 09/21/21  ?  ?- practice safe sex ?- schedule appointment for flu shot ?- schedule appointment for vaccines needed due to my age or health ?- schedule recommended health tests (blood work, mammogram, colonoscopy, pap test) ?- schedule and keep appointment for annual check-up  ?  ?Why is this important?   ?Screening tests can find diseases early when they are easier to treat.  ?Your doctor or nurse will talk with you about which tests are important for you.  ?Getting shots for common diseases like the flu and shingles will help prevent them.   ?  ? ?Follow Up:  Patient agrees to Care Plan and Follow-up. ? ?Plan: The Managed Medicaid care management team will reach out to the patient again over the next 30 days. and The  Patient has been provided with  contact information for the Managed Medicaid care management team and has been advised to call with any health related questions or concerns. ? ?Date/time of next scheduled RN care management/care coordination outreach: 09/18/21 at 230. ?

## 2021-08-21 NOTE — Patient Instructions (Signed)
Hi Robyn Stone, great to speak with you this afternoon-have a great afternoon! ? ?Robyn Stone was given information about Medicaid Managed Care team care coordination services as a part of their [facility] Medicaid benefit. Robyn Stone verbally consented to engagement with the [facility] Managed Care team.  ? ?If you are experiencing a medical emergency, please call 911 or report to your local emergency department or urgent care.  ? ?If you have a non-emergency medical problem during routine business hours, please contact your provider's office and ask to speak with a nurse.  ? ?For questions related to your [facility] health plan, please call: [phone] or go here:[email or web address] ? ?If you would like to schedule transportation through your [facility] plan, please call the following number at least 2 days in advance of your appointment: [phone]. ? You can also use the MTM portal or MTM mobile app to manage your rides. For the portal, please go to [email or web address]. ? ?Call the [place] at [phone], at any time, 24 hours a day, 7 days a week. If you are in danger or need immediate medical attention call 911. ? ?If you would like help to quit smoking, call 1-800-QUIT-NOW 215-522-3825) OR Espa?ol: 1-855-D?jelo-Ya 906-698-5918) o para m?s informaci?n haga clic aqu? or Text READY to 200-400 to register via text ? ?Robyn Stone - following are the goals we discussed in your visit today:  ? Goals Addressed   ? ?Long-Range Goal: Establish Plan of Care for Care Management Needs   ?Start Date: 08/21/2021  ?Expected End Date: 11/21/2021  ?Priority: High  ?Note:   ?Timeframe:  Long-Range Goal ?Priority:  High ?Start Date:      08/21/21                       ?Expected End Date:   ongoing                   ? ?Follow Up Date 09/21/21  ?  ?- practice safe sex ?- schedule appointment for flu shot ?- schedule appointment for vaccines needed due to my age or  health ?- schedule recommended health tests (blood work, mammogram, colonoscopy, pap test) ?- schedule and keep appointment for annual check-up  ?  ?Why is this important?   ?Screening tests can find diseases early when they are easier to treat.  ?Your doctor or nurse will talk with you about which tests are important for you.  ?Getting shots for common diseases like the flu and shingles will help prevent them.   ?  ? ?Patient verbalizes understanding of instructions and care plan provided today and agrees to view in [place]. Active MyChart status confirmed with patient.   ? ?The Managed Medicaid care management team will reach out to the patient again over the next 30 days.  ?The  Patient has been provided with contact information for the Managed Medicaid care management team and has been advised to call with any health related questions or concerns.  ? ?Aida Raider RN, BSN ?[facility] ?Care Management Coordinator - Managed Medicaid High Risk ?[phone] ?  ?Following is a copy of your plan of care:  ?Care Plan : [place] of Care  ?Updates made by Gayla Medicus, RN since 08/21/2021 12:00 AM  ?  ? ?Problem: Care Management and Care Coordiantion   ?Priority: High  ?  ? ?Long-Range Goal: Self-Management Plan Developed   ?Start Date: 08/21/2021  ?Expected  End Date: 11/21/2021  ?This Visit's Progress: On track  ?Priority: Medium  ?Note:   ?Current Barriers:  ?Knowledge Deficits related to plan of care for management of irregular vaginal bleeding with new IUD-placed approximately one month ago  ? ?RNCM Clinical Goal(s):  ?Patient will verbalize understanding of plan for management of irregular vaginal bleeding as evidenced by patient report ?attend all scheduled medical appointments as evidenced by patient report ?continue to work with RN Care Manager to address care management and care coordination needs related to  irregular vaginal bleeding as evidenced by adherence to CM Team  Scheduled appointments through collaboration with RN Care manager, provider, and care team.  ? ?Interventions: ?Inter-disciplinary care team collaboration (see longitudinal plan of care) ?Evaluation of current treatment plan related to  self management and patient's adherence to plan as established by provider ? ?  (Status:  New goal.)  Long Term Goal ?Evaluation of current treatment plan related to  irregular vaginal bleeding , self-management and patient's adherence to plan as established by provider. ?Discussed plans with patient for ongoing care management follow up and provided patient with direct contact information for care management team ?Advised patient to contact provider regarding irregular vaginal bleeding and refill of iron prescription. ?Reviewed medications with patient ?Reviewed scheduled/upcoming provider appointments ?Discussed plans with patient for ongoing care management follow up and provided patient with direct contact information for care management team ? ?Patient Goals/Self-Care Activities: ?Take all medications as prescribed ?Attend all scheduled provider appointments ?Call pharmacy for medication refills 3-7 days in advance of running out of medications ?Perform all self care activities independently  ?Perform IADL's (shopping, preparing meals, housekeeping, managing finances) independently ?Call provider office for new concerns or questions  ? ?Follow Up Plan:  The patient has been provided with contact information for the care management team and has been advised to call with any health related questions or concerns.  ?The care management team will reach out to the patient again over the next 30 days.  ? ? ?  ?

## 2021-08-31 DIAGNOSIS — Z419 Encounter for procedure for purposes other than remedying health state, unspecified: Secondary | ICD-10-CM | POA: Diagnosis not present

## 2021-09-09 ENCOUNTER — Telehealth: Payer: Self-pay | Admitting: *Deleted

## 2021-09-09 NOTE — Telephone Encounter (Signed)
Patient sent appointment request to [facility] instead of [facility]. Patient will call [place] office to schedule. ?

## 2021-09-18 ENCOUNTER — Other Ambulatory Visit: Payer: Self-pay | Admitting: Obstetrics and Gynecology

## 2021-09-18 NOTE — Patient Outreach (Signed)
Care Coordination ? ?09/18/2021 ? ?Robyn Stone ?Oct 26, 1995 ?[id number] ? ?RNCM called patient at scheduled time.  Patient answered phone and asked to be called back in 30 minutes. RNCM called patient back in 30 minutes as requested-no answer, left message. ? ?Kathi Der RN, BSN ?[place]  [facility] ?Care Management Coordinator - Managed Medicaid High Risk ?[phone] ?   ? ? ?

## 2021-09-30 DIAGNOSIS — Z419 Encounter for procedure for purposes other than remedying health state, unspecified: Secondary | ICD-10-CM | POA: Diagnosis not present

## 2021-10-23 ENCOUNTER — Other Ambulatory Visit: Payer: Self-pay | Admitting: Obstetrics and Gynecology

## 2021-10-23 NOTE — Patient Instructions (Signed)
Visit Information  Ms. Waguespack was given information about Medicaid Managed Care team care coordination services as a part of their [facility] Medicaid benefit. AZALEA CEDAR verbally consented to engagement with the [facility] Managed Care team.   If you are experiencing a medical emergency, please call 911 or report to your local emergency department or urgent care.   If you have a non-emergency medical problem during routine business hours, please contact your provider's office and ask to speak with a nurse.   For questions related to your [facility] health plan, please call: [phone] or go here:[email or web address]  If you would like to schedule transportation through your [facility] plan, please call the following number at least 2 days in advance of your appointment: [phone].  You can also use the MTM portal or MTM mobile app to manage your rides. For the portal, please go to [email or web address]  Call the [facility] Crisis Line at [phone], at any time, 24 hours a day, 7 days a week. If you are in danger or need immediate medical attention call 911.  If you would like help to quit smoking, call 1-800-QUIT-NOW (210-706-7853) OR Espaol: 1-855-Djelo-Ya (7-619-509-3267) o para ms informacin haga clic aqu or Text READY to 124-580 to register via text  Robyn Stone - following are the goals we discussed in your visit today:   Goals Addressed    Long-Range Goal: Establish Plan of Care for Care Management Needs   Start Date: 08/21/2021  Expected End Date: 01/23/2022  Priority: High  Note:   Timeframe:  Long-Range Goal Priority:  High Start Date:      08/21/21                       Expected End Date:   ongoing                    Follow Up Date 11/22/21   - practice safe sex - schedule appointment for flu shot - schedule appointment for vaccines needed due to my age or health - schedule recommended health tests (blood work, mammogram,  colonoscopy, pap test) - schedule and keep appointment for annual check-up    Why is this important?   Screening tests can find diseases early when they are easier to treat.  Your doctor or nurse will talk with you about which tests are important for you.  Getting shots for common diseases like the flu and shingles will help prevent them.   10/23/21:  No complaints today-no follow up appts   Patient verbalizes understanding of instructions and care plan provided today and agrees to view in MyChart. Active MyChart status and patient understanding of how to access instructions and care plan via MyChart confirmed with patient.     The Managed Medicaid care management team will reach out to the patient again over the next 30 days.  The  Patient has been provided with contact information for the Managed Medicaid care management team and has been advised to call with any health related questions or concerns.   Kathi Der RN, BSN   [facility] Care Management Coordinator - Managed Medicaid High Risk [phone]  Following is a copy of your plan of care:  Care Plan : RN Care Manager Plan of Care  Updates made by Danie Chandler, RN since 10/23/2021 12:00 AM     Problem: Care Management and Care Coordiantion   Priority: High     Long-Range Goal: Self-Management Plan  Developed   Start Date: 08/21/2021  Expected End Date: 01/23/2022  Recent Progress: On track  Priority: Medium  Note:   Current Barriers:  Knowledge Deficits related to plan of care for management of irregular vaginal bleeding with new IUD-placed approximately one month ago  10/23/21:  Patient states regular periods now-no irregular bleeding.  Patient states she will follow up with provider regarding iron prescription  RNCM Clinical Goal(s):  Patient will verbalize understanding of plan for management of irregular vaginal bleeding as evidenced by patient report attend all scheduled medical appointments  as evidenced by patient report continue to work with RN Care Manager to address care management and care coordination needs related to  irregular vaginal bleeding as evidenced by adherence to CM Team Scheduled appointments through collaboration with RN Care manager, provider, and care team.   Interventions: Inter-disciplinary care team collaboration (see longitudinal plan of care) Evaluation of current treatment plan related to  self management and patient's adherence to plan as established by provider    (Status:  New goal.)  Long Term Goal Evaluation of current treatment plan related to  irregular vaginal bleeding , self-management and patient's adherence to plan as established by provider. Discussed plans with patient for ongoing care management follow up and provided patient with direct contact information for care management team Advised patient to contact provider regarding irregular vaginal bleeding and refill of iron prescription. Reviewed medications with patient Reviewed scheduled/upcoming provider appointments Discussed plans with patient for ongoing care management follow up and provided patient with direct contact information for care management team  Patient Goals/Self-Care Activities: Take all medications as prescribed Attend all scheduled provider appointments Call pharmacy for medication refills 3-7 days in advance of running out of medications Perform all self care activities independently  Perform IADL's (shopping, preparing meals, housekeeping, managing finances) independently Call provider office for new concerns or questions   Follow Up Plan:  The patient has been provided with contact information for the care management team and has been advised to call with any health related questions or concerns.  The care management team will reach out to the patient again over the next 30 days.

## 2021-10-23 NOTE — Patient Outreach (Signed)
Medicaid Managed Care   Nurse Care Manager Note  10/23/2021 Name:  Robyn Stone MRN:  [id number] DOB:  1995-11-05  Robyn Stone is an 26 y.o. year old female who is a primary patient of [facility].  The [facility] Managed Care Coordination team was consulted for assistance with:    Healthcare management needs, health maintenance  Robyn Stone was given information about Medicaid Managed Care Coordination team services today. Robyn Stone Patient agreed to services and verbal consent obtained.  Engaged with patient by telephone for follow up visit in response to provider referral for case management and/or care coordination services.   Assessments/Interventions:  Review of past medical history, allergies, medications, health status, including review of consultants reports, laboratory and other test data, was performed as part of comprehensive evaluation and provision of chronic care management services.  SDOH (Social Determinants of Health) assessments and interventions performed: SDOH Interventions    Flowsheet Row Most Recent Value  SDOH Interventions   Food Insecurity Interventions Intervention Not Indicated  Physical Activity Interventions Intervention Not Indicated      Care Plan  No Known Allergies  Medications Reviewed Today     Reviewed by Danie Chandler, RN (Registered Nurse) on 10/23/21 at [phone]  Med List Status: <None>   Medication Order Taking? Sig Documenting Provider Last Dose Status Informant  acetaminophen (TYLENOL) 500 MG tablet [id number] No Take 1-2 tablets (500-1,000 mg total) by mouth every 6 (six) hours as needed for moderate pain, fever or headache. Gerrit Heck, CNM Taking Active   ibuprofen (ADVIL) 600 MG tablet [id number] No Take 1 tablet (600 mg total) by mouth every 6 (six) hours as needed. Allayne Stack, DO Taking Active            Patient Active Problem List   Diagnosis Date Noted   Indication for care in labor and  delivery, antepartum 05/11/2021   Term pregnancy 05/10/2021   Rubella non-immune status, antepartum 10/31/2020   Supervision of normal pregnancy, antepartum 09/11/2020   Conditions to be addressed/monitored per PCP order:   healthcare management needs, health maintenance  Care Plan : RN Care Manager Plan of Care  Updates made by Danie Chandler, RN since 10/23/2021 12:00 AM     Problem: Care Management and Care Coordiantion   Priority: High     Long-Range Goal: Self-Management Plan Developed   Start Date: 08/21/2021  Expected End Date: 01/23/2022  Recent Progress: On track  Priority: Medium  Note:   Current Barriers:  Knowledge Deficits related to plan of care for management of irregular vaginal bleeding with new IUD-placed approximately one month ago  10/23/21:  Patient states regular periods now-no irregular bleeding.  Patient states she will follow up with provider regarding iron prescription  RNCM Clinical Goal(s):  Patient will verbalize understanding of plan for management of irregular vaginal bleeding as evidenced by patient report attend all scheduled medical appointments as evidenced by patient report continue to work with RN Care Manager to address care management and care coordination needs related to  irregular vaginal bleeding as evidenced by adherence to CM Team Scheduled appointments through collaboration with RN Care manager, provider, and care team.   Interventions: Inter-disciplinary care team collaboration (see longitudinal plan of care) Evaluation of current treatment plan related to  self management and patient's adherence to plan as established by provider    (Status:  New goal.)  Long Term Goal Evaluation of current treatment plan related to  irregular vaginal bleeding ,  self-management and patient's adherence to plan as established by provider. Discussed plans with patient for ongoing care management follow up and provided patient with direct contact information  for care management team Advised patient to contact provider regarding irregular vaginal bleeding and refill of iron prescription. Reviewed medications with patient Reviewed scheduled/upcoming provider appointments Discussed plans with patient for ongoing care management follow up and provided patient with direct contact information for care management team  Patient Goals/Self-Care Activities: Take all medications as prescribed Attend all scheduled provider appointments Call pharmacy for medication refills 3-7 days in advance of running out of medications Perform all self care activities independently  Perform IADL's (shopping, preparing meals, housekeeping, managing finances) independently Call provider office for new concerns or questions   Follow Up Plan:  The patient has been provided with contact information for the care management team and has been advised to call with any health related questions or concerns.  The care management team will reach out to the patient again over the next 30 days.   Long-Range Goal: Establish Plan of Care for Care Management Needs   Start Date: 08/21/2021  Expected End Date: 01/23/2022  Priority: High  Note:   Timeframe:  Long-Range Goal Priority:  High Start Date:      08/21/21                       Expected End Date:   ongoing                    Follow Up Date 11/22/21   - practice safe sex - schedule appointment for flu shot - schedule appointment for vaccines needed due to my age or health - schedule recommended health tests (blood work, mammogram, colonoscopy, pap test) - schedule and keep appointment for annual check-up    Why is this important?   Screening tests can find diseases early when they are easier to treat.  Your doctor or nurse will talk with you about which tests are important for you.  Getting shots for common diseases like the flu and shingles will help prevent them.   10/23/21:  No complaints today-no follow up appts     Follow Up:  Patient agrees to Care Plan and Follow-up.  Plan: The Managed Medicaid care management team will reach out to the patient again over the next 30 days. and The  Patient has been provided with contact information for the Managed Medicaid care management team and has been advised to call with any health related questions or concerns.  Date/time of next scheduled RN care management/care coordination outreach:  11/22/21 at 230.

## 2021-10-31 DIAGNOSIS — Z419 Encounter for procedure for purposes other than remedying health state, unspecified: Secondary | ICD-10-CM | POA: Diagnosis not present

## 2021-11-22 ENCOUNTER — Other Ambulatory Visit: Payer: Self-pay | Admitting: Obstetrics and Gynecology

## 2021-11-22 NOTE — Patient Outreach (Signed)
Care Coordination  11/22/2021  Robyn Stone 1995-10-01 [id number]   Medicaid Managed Care   Unsuccessful Outreach Note  11/22/2021 Name: Robyn Stone MRN: [id number] DOB: 13-Nov-1995  Referred by: [facility] Reason for referral : High Risk Managed Medicaid (Unsuccessful telephone outreach)   An unsuccessful telephone outreach was attempted today. The patient was referred to the case management team for assistance with care management and care coordination.   Follow Up Plan: The care management team will reach out to the patient again over the next 30 business days.   Kathi Der RN, BSN [name]  [facility] - Managed Medicaid High Risk [phone]

## 2021-11-28 DIAGNOSIS — Z113 Encounter for screening for infections with a predominantly sexual mode of transmission: Secondary | ICD-10-CM | POA: Diagnosis not present

## 2021-11-29 ENCOUNTER — Ambulatory Visit: Payer: Medicaid Other | Admitting: Family Medicine

## 2021-11-30 DIAGNOSIS — Z419 Encounter for procedure for purposes other than remedying health state, unspecified: Secondary | ICD-10-CM | POA: Diagnosis not present

## 2021-12-24 ENCOUNTER — Ambulatory Visit: Payer: Medicaid Other | Admitting: Obstetrics and Gynecology

## 2021-12-30 ENCOUNTER — Other Ambulatory Visit: Payer: Self-pay | Admitting: Obstetrics and Gynecology

## 2021-12-30 NOTE — Patient Outreach (Signed)
Care Coordination  12/30/2021  Robyn Stone 03-09-1996 [id number]   Medicaid Managed Care   Unsuccessful Outreach Note  12/30/2021 Name: Robyn Stone MRN: [id number] DOB: September 28, 1995  [facility]erred by: [facility] Reason for referral : High Risk Managed Medicaid (Unsuccessful telephone outreach)   A second unsuccessful telephone outreach was attempted today. The patient was referred to the case management team for assistance with care management and care coordination.   Follow Up Plan: The care management team will reach out to the patient again over the next 30 business  days.   Kathi Der RN, BSN [place]  [facility] - Managed Medicaid High Risk [phone].

## 2021-12-30 NOTE — Patient Instructions (Signed)
Hi Ms. Noori, I am sorry I missed you today, I hope you and your family are well- as a part of your Medicaid benefit, you are eligible for care management and care coordination services at no cost or copay. I was unable to reach you by phone today but would be happy to help you with your health related needs. Please feel free to call me at [phone].  A member of the Managed Medicaid care management team will reach out to you again over the next 30 business  days.   Kathi Der RN, BSN [place]  [facility] - Managed Medicaid High Risk [phone]

## 2021-12-31 DIAGNOSIS — Z419 Encounter for procedure for purposes other than remedying health state, unspecified: Secondary | ICD-10-CM | POA: Diagnosis not present

## 2022-01-31 DIAGNOSIS — Z419 Encounter for procedure for purposes other than remedying health state, unspecified: Secondary | ICD-10-CM | POA: Diagnosis not present

## 2022-02-06 ENCOUNTER — Other Ambulatory Visit: Payer: Self-pay | Admitting: Obstetrics and Gynecology

## 2022-02-06 NOTE — Patient Instructions (Signed)
Visit Information  Ms. Robyn Stone  - as a part of your Medicaid benefit, you are eligible for care management and care coordination services at no cost or copay. I was unable to reach you by phone today but would be happy to help you with your health related needs. Please feel free to call me at [phone].  Kathi Der RN, BSN [name]  [facility] - Managed Medicaid High Risk [phone]

## 2022-02-06 NOTE — Patient Outreach (Signed)
Care Coordination  Medicaid Managed Care   Unsuccessful Outreach Note  02/06/2022 Name: Robyn Stone MRN: [id number] DOB: 16-May-1996  [facility]erred by: [facility] Reason for referral : High Risk Managed Medicaid (Unsuccessful telephone outreach)   Third unsuccessful telephone outreach was attempted today. The patient was referred to the case management team for assistance with care management and care coordination. The patient's primary care provider has been notified of our unsuccessful attempts to make or maintain contact with the patient. The care management team is pleased to engage with this patient at any time in the future should he/she be interested in assistance from the care management team.   Follow Up Plan: We have been unable to make contact with the patient for follow up. The care management team is available to follow up with the patient after provider conversation with the patient regarding recommendation for care management engagement and subsequent re-referral to the care management team.   Kathi Der RN, BSN [place]  [facility] Care Management Coordinator - Managed [place] High Risk [phone]

## 2022-02-09 DIAGNOSIS — Z743 Need for continuous supervision: Secondary | ICD-10-CM | POA: Diagnosis not present

## 2022-02-09 DIAGNOSIS — S0990XA Unspecified injury of head, initial encounter: Secondary | ICD-10-CM | POA: Diagnosis not present

## 2022-03-02 DIAGNOSIS — Z419 Encounter for procedure for purposes other than remedying health state, unspecified: Secondary | ICD-10-CM | POA: Diagnosis not present

## 2022-04-02 DIAGNOSIS — Z419 Encounter for procedure for purposes other than remedying health state, unspecified: Secondary | ICD-10-CM | POA: Diagnosis not present

## 2022-05-02 DIAGNOSIS — Z419 Encounter for procedure for purposes other than remedying health state, unspecified: Secondary | ICD-10-CM | POA: Diagnosis not present

## 2022-06-02 DIAGNOSIS — Z419 Encounter for procedure for purposes other than remedying health state, unspecified: Secondary | ICD-10-CM | POA: Diagnosis not present

## 2022-07-03 DIAGNOSIS — Z419 Encounter for procedure for purposes other than remedying health state, unspecified: Secondary | ICD-10-CM | POA: Diagnosis not present

## 2022-08-01 DIAGNOSIS — Z419 Encounter for procedure for purposes other than remedying health state, unspecified: Secondary | ICD-10-CM | POA: Diagnosis not present

## 2022-09-01 DIAGNOSIS — Z419 Encounter for procedure for purposes other than remedying health state, unspecified: Secondary | ICD-10-CM | POA: Diagnosis not present

## 2022-10-01 DIAGNOSIS — Z419 Encounter for procedure for purposes other than remedying health state, unspecified: Secondary | ICD-10-CM | POA: Diagnosis not present

## 2022-11-01 DIAGNOSIS — Z419 Encounter for procedure for purposes other than remedying health state, unspecified: Secondary | ICD-10-CM | POA: Diagnosis not present

## 2022-11-13 ENCOUNTER — Other Ambulatory Visit (HOSPITAL_COMMUNITY)
Admission: RE | Admit: 2022-11-13 | Discharge: 2022-11-13 | Disposition: A | Discharge status: Home / Self Care | Payer: Medicaid Other | Source: Ambulatory Visit | Attending: Obstetrics & Gynecology | Admitting: Obstetrics & Gynecology

## 2022-11-13 ENCOUNTER — Ambulatory Visit (INDEPENDENT_AMBULATORY_CARE_PROVIDER_SITE_OTHER): Payer: Medicaid Other | Admitting: Obstetrics & Gynecology

## 2022-11-13 VITALS — BP 115/75 | HR 79 | Wt 171.0 lb

## 2022-11-13 DIAGNOSIS — N930 Postcoital and contact bleeding: Secondary | ICD-10-CM

## 2022-11-13 DIAGNOSIS — R102 Pelvic and perineal pain: Secondary | ICD-10-CM

## 2022-11-13 DIAGNOSIS — R300 Dysuria: Secondary | ICD-10-CM | POA: Diagnosis not present

## 2022-11-13 LAB — POCT URINALYSIS DIPSTICK
Bilirubin, UA: NEGATIVE
Glucose, UA: NEGATIVE
Ketones, UA: NEGATIVE
Nitrite, UA: NEGATIVE
Protein, UA: NEGATIVE
Spec Grav, UA: 1.01 (ref 1.010–1.025)
Urobilinogen, UA: 0.2 E.U./dL
pH, UA: 7 (ref 5.0–8.0)

## 2022-11-13 LAB — POCT URINE PREGNANCY: Preg Test, Ur: NEGATIVE

## 2022-11-13 NOTE — Progress Notes (Signed)
Pt in office for IUD check. Pt states she has been having light bleeding with intercourse. Pt also having urinary symptoms.  Pt partner can also feels strings from IUD.  Pt also request UPT.

## 2022-11-13 NOTE — Progress Notes (Signed)
Patient ID: Robyn Stone, female   DOB: July 26, 1995, 27 y.o.   MRN: [id number]  Chief Complaint  Patient presents with   Gynecologic Exam   Contraception    IUD check    HPI Robyn Stone is a 27 y.o. female.  [id number] No LMP recorded. She has noted postcoital bleeding since last Thursday (7 days) having no menses with her IUD prior to this. She also has pelvic pain. No discharge or dysuria.   HPI  Past Medical History:  Diagnosis Date   Anxiety    Headache    Medical history non-contributory    Pre-eclampsia    Pregnancy induced hypertension     Past Surgical History:  Procedure Laterality Date   NO PAST SURGERIES      Family History  Problem Relation Age of Onset   Hypertension Mother    Stroke Father     Social History Social History   Tobacco Use   Smoking status: Former    Types: Cigarettes    Quit date: 08/01/2019    Years since quitting: 3.2   Smokeless tobacco: Never   Tobacco comments:    1/2 day  Vaping Use   Vaping Use: Every day  Substance Use Topics   Alcohol use: Yes    Comment: Every other weekend   Drug use: Not Currently    Types: Marijuana    Comment: last use 3/22    No Known Allergies  Current Outpatient Medications  Medication Sig Dispense Refill   acetaminophen (TYLENOL) 500 MG tablet Take 1-2 tablets (500-1,000 mg total) by mouth every 6 (six) hours as needed for moderate pain, fever or headache. 60 tablet 2   ibuprofen (ADVIL) 600 MG tablet Take 1 tablet (600 mg total) by mouth every 6 (six) hours as needed. 30 tablet 0   No current facility-administered medications for this visit.    Review of Systems Review of Systems  Constitutional: Negative.   Respiratory: Negative.    Cardiovascular: Negative.   Gastrointestinal: Negative.   Genitourinary:  Positive for pelvic pain and vaginal bleeding. Negative for dysuria and vaginal discharge.    Blood pressure 115/75, pulse 79, weight 171 lb (77.6 kg), not currently  breastfeeding.  Physical Exam Physical Exam Vitals and nursing note reviewed. Exam conducted with a chaperone present.  Constitutional:      Appearance: Normal appearance. She is not ill-appearing.  Cardiovascular:     Rate and Rhythm: Normal rate.  Pulmonary:     Effort: Pulmonary effort is normal.  Abdominal:     General: Abdomen is flat.     Palpations: Abdomen is soft.  Genitourinary:    General: Normal vulva.     Exam position: Lithotomy position.     Vagina: Vaginal discharge present.     Cervix: Discharge and cervical bleeding present.     Uterus: Tender.      Adnexa: Right adnexa normal and left adnexa normal.  Skin:    General: Skin is warm and dry.  Psychiatric:        Mood and Affect: Mood normal.        Behavior: Behavior normal.    IUD strings 2 cm, normal Data Reviewed   Assessment Dysuria - Plan: POCT urinalysis dipstick, Urine Culture  Pelvic pain - Plan: Cervicovaginal ancillary only( [place]), POCT urine pregnancy  Bleeding after intercourse - [facility]   Plan F/U on STD testing, urine culture    Scheryl Darter 11/13/2022, 5:01 PM

## 2022-11-15 LAB — URINE CULTURE

## 2022-11-17 LAB — CERVICOVAGINAL ANCILLARY ONLY
Bacterial Vaginitis (gardnerella): POSITIVE — AB
Candida Glabrata: NEGATIVE
Candida Vaginitis: NEGATIVE
Chlamydia: NEGATIVE
Comment: NEGATIVE
Comment: NEGATIVE
Comment: NEGATIVE
Comment: NEGATIVE
Comment: NEGATIVE
Comment: NORMAL
Neisseria Gonorrhea: NEGATIVE
Trichomonas: NEGATIVE

## 2022-12-01 DIAGNOSIS — Z419 Encounter for procedure for purposes other than remedying health state, unspecified: Secondary | ICD-10-CM | POA: Diagnosis not present

## 2023-01-01 DIAGNOSIS — Z419 Encounter for procedure for purposes other than remedying health state, unspecified: Secondary | ICD-10-CM | POA: Diagnosis not present

## 2023-02-01 DIAGNOSIS — Z419 Encounter for procedure for purposes other than remedying health state, unspecified: Secondary | ICD-10-CM | POA: Diagnosis not present

## 2023-03-03 DIAGNOSIS — Z419 Encounter for procedure for purposes other than remedying health state, unspecified: Secondary | ICD-10-CM | POA: Diagnosis not present

## 2023-04-03 DIAGNOSIS — Z419 Encounter for procedure for purposes other than remedying health state, unspecified: Secondary | ICD-10-CM | POA: Diagnosis not present

## 2023-05-03 DIAGNOSIS — Z419 Encounter for procedure for purposes other than remedying health state, unspecified: Secondary | ICD-10-CM | POA: Diagnosis not present

## 2023-06-03 DIAGNOSIS — Z419 Encounter for procedure for purposes other than remedying health state, unspecified: Secondary | ICD-10-CM | POA: Diagnosis not present

## 2023-06-14 DIAGNOSIS — Z419 Encounter for procedure for purposes other than remedying health state, unspecified: Secondary | ICD-10-CM | POA: Diagnosis not present

## 2023-07-04 DIAGNOSIS — Z419 Encounter for procedure for purposes other than remedying health state, unspecified: Secondary | ICD-10-CM | POA: Diagnosis not present

## 2023-07-08 ENCOUNTER — Ambulatory Visit: Payer: Medicaid Other | Admitting: Obstetrics and Gynecology

## 2023-07-15 DIAGNOSIS — Z419 Encounter for procedure for purposes other than remedying health state, unspecified: Secondary | ICD-10-CM | POA: Diagnosis not present

## 2023-08-01 DIAGNOSIS — Z419 Encounter for procedure for purposes other than remedying health state, unspecified: Secondary | ICD-10-CM | POA: Diagnosis not present

## 2023-08-12 DIAGNOSIS — Z419 Encounter for procedure for purposes other than remedying health state, unspecified: Secondary | ICD-10-CM | POA: Diagnosis not present

## 2023-08-17 ENCOUNTER — Encounter: Payer: Self-pay | Admitting: Advanced Practice Midwife

## 2023-08-17 ENCOUNTER — Other Ambulatory Visit (HOSPITAL_COMMUNITY)
Admission: RE | Admit: 2023-08-17 | Discharge: 2023-08-17 | Disposition: A | Discharge status: Home / Self Care | Source: Ambulatory Visit | Attending: Advanced Practice Midwife | Admitting: Advanced Practice Midwife

## 2023-08-17 ENCOUNTER — Ambulatory Visit: Payer: Medicaid Other | Admitting: Advanced Practice Midwife

## 2023-08-17 ENCOUNTER — Encounter: Payer: Self-pay | Admitting: Obstetrics and Gynecology

## 2023-08-17 VITALS — BP 103/69 | HR 64 | Ht 62.0 in | Wt 140.8 lb

## 2023-08-17 DIAGNOSIS — Z1151 Encounter for screening for human papillomavirus (HPV): Secondary | ICD-10-CM | POA: Diagnosis not present

## 2023-08-17 DIAGNOSIS — Z124 Encounter for screening for malignant neoplasm of cervix: Secondary | ICD-10-CM | POA: Diagnosis not present

## 2023-08-17 DIAGNOSIS — Z113 Encounter for screening for infections with a predominantly sexual mode of transmission: Secondary | ICD-10-CM | POA: Insufficient documentation

## 2023-08-17 DIAGNOSIS — Z975 Presence of (intrauterine) contraceptive device: Secondary | ICD-10-CM | POA: Insufficient documentation

## 2023-08-17 DIAGNOSIS — Z01419 Encounter for gynecological examination (general) (routine) without abnormal findings: Secondary | ICD-10-CM

## 2023-08-17 NOTE — Progress Notes (Signed)
 Subjective:     Robyn Stone is a 28 y.o. female here at [facility] for a routine exam.  Current complaints: irregular spotting, pain with intercourse.  Personal and family health history reviewed: yes.  Do you have a primary care provider? Yes, but can not remember the name.  Do you feel safe at home? Not asked- Patient's support person and 2 children present  Flowsheet Row Office Visit from 08/17/2023 in [facility] Total Score 0       Health Maintenance Due  Topic Date Due   INFLUENZA VACCINE  01/01/2023   COVID-19 Vaccine (1 - 2024-25 season) Never done     Risk factors for chronic health problems:  Smoking: Patient reports that she vapes nicotine. Patient is not interested in quitting at this time  Alchohol/how much: none  Pt BMI: Body mass index is 25.75 kg/m. Patient reports that she goes for walks and does body weight squats every other day.   Seat belt- Patient reports wearing a seatbelt in the car with every ride, but reports she does not wear it the correct way. She states "I put the strap behind my back" I reviewed with patient the correct and safest way to wear seatbelt is with the strap across the chest.    Gynecologic History Menstrual status- IUD Contraception: IUD Patient loves her IUD and does not want it removed anytime soon.  Last Pap: 10/23/2020. Results were: normal Last mammogram: not indicated  Obstetric History OB History  Gravida Para Term Preterm AB Living  4 3 3  1 3   SAB IAB Ectopic Multiple Live Births  1   0 3    # Outcome Date GA Lbr Len/2nd Weight Sex Type Anes PTL Lv  4 Term 05/11/21 [redacted]w[redacted]d 01:38 / 00:02 3062 g M Vag-Spont EPI  LIV  3 Term 01/15/20 [redacted]w[redacted]d 04:04 / 00:02 2860 g M Vag-Spont EPI  LIV  2 Term 08/29/14 [redacted]w[redacted]d 06:00 / 00:25 2469 g M Vag-Spont EPI  LIV  1 SAB              The following portions of the patient's history were reviewed and updated as appropriate: allergies,  current medications, past family history, past medical history, past social history, past surgical history, and problem list.  Review of Systems  Denies vaginal discharge.  Reports pain with intercourse Reports vaginal spotting Denies any other complaints   Objective:  BP 103/69   Pulse 64   Ht 5\' 2"  (1.575 m)   Wt 63.9 kg   BMI 25.75 kg/m   VS reviewed, nursing note reviewed,  Constitutional: well developed, well nourished, no distress HEENT: normocephalic, thyroid without enlargement or mass HEART: RRR, no murmurs rubs/gallops RESP: clear and equal to auscultation bilaterally in all lobes  Breast Exam: performed- I discussed recommendation to start mammogram between 40-50 yo/ exam performed: right breast normal without mass, skin or nipple changes or axillary nodes, left breast normal without mass, skin or nipple changes or axillary nodes fibrous tissue noted equally bilaterally  Abdomen: soft Neuro: alert and oriented x 3 Skin: warm, dry Psych: affect normal Pelvic exam: Performed: Cervix red, visually closed, without lesion, scant white creamy watery discharge, vaginal walls and external genitalia normal Bimanual exam: deferred     Assessment/Plan:   1. Encounter for annual routine gynecological examination   2. Screening for cervical cancer (Primary)  - Cytology - PAP( [place])  3. Screening examination for  STI  - Cervicovaginal ancillary only( [place]) - RPR - HIV antibody (with reflex) - Hepatitis C Antibody - Hepatitis B Surface AntiGEN      Return in 1 year for annual exam or sooner if new or worsening symptoms.   Nikki Risheq- Student NP 1:45 PM     CNM attestation:  I have seen and examined this patient; I agree with above documentation in the NP student's note.   Robyn Stone is a 28 y.o. [phone] in the [facility] office for routine/problem gyn visit.  See problem list below.   Patient Active Problem List   Diagnosis Date Noted    Indication for care in labor and delivery, antepartum 05/11/2021   Term pregnancy 05/10/2021   Rubella non-immune status, antepartum 10/31/2020   Supervision of normal pregnancy, antepartum 09/11/2020     ROS, labs, PMH reviewed  PE: BP 103/69   Pulse 64   Ht 5\' 2"  (1.575 m)   Wt 140 lb 12.8 oz (63.9 kg)   BMI 25.75 kg/m  Gen: calm comfortable, well appearing Resp: normal effort, no distress Abd: soft/nontender Pelvic:  wnl   Plan: 1. Encounter for annual routine gynecological examination   2. Screening for cervical cancer (Primary)  - Cytology - PAP( [place])  3. Screening examination for STI  - Cervicovaginal ancillary only( [place]) - RPR - HIV antibody (with reflex) - Hepatitis C Antibody - Hepatitis B Surface AntiGEN      Sharen Counter, CNM 6:32 PM

## 2023-08-17 NOTE — Progress Notes (Signed)
 Pt presents for AEX.  Last PAP 09/2020 Requesting STD testing.  Pt has Mirena and c/o pain with intercourse and spotting.

## 2023-08-19 LAB — HEPATITIS C ANTIBODY: Hep C Virus Ab: NONREACTIVE

## 2023-08-19 LAB — CERVICOVAGINAL ANCILLARY ONLY
Bacterial Vaginitis (gardnerella): POSITIVE — AB
Candida Glabrata: NEGATIVE
Candida Vaginitis: NEGATIVE
Chlamydia: NEGATIVE
Comment: NEGATIVE
Comment: NEGATIVE
Comment: NEGATIVE
Comment: NEGATIVE
Comment: NEGATIVE
Comment: NORMAL
Neisseria Gonorrhea: NEGATIVE
Trichomonas: NEGATIVE

## 2023-08-19 LAB — RPR: RPR Ser Ql: NONREACTIVE

## 2023-08-19 LAB — HEPATITIS B SURFACE ANTIGEN: Hepatitis B Surface Ag: NEGATIVE

## 2023-08-19 LAB — HIV ANTIBODY (ROUTINE TESTING W REFLEX): HIV Screen 4th Generation wRfx: NONREACTIVE

## 2023-08-20 LAB — CYTOLOGY - PAP: Diagnosis: HIGH — AB

## 2023-08-29 ENCOUNTER — Encounter: Payer: Self-pay | Admitting: Advanced Practice Midwife

## 2023-09-02 ENCOUNTER — Telehealth: Admitting: Obstetrics and Gynecology

## 2023-09-02 DIAGNOSIS — R87613 High grade squamous intraepithelial lesion on cytologic smear of cervix (HGSIL): Secondary | ICD-10-CM

## 2023-09-02 NOTE — Progress Notes (Signed)
    GYNECOLOGY VIRTUAL VISIT ENCOUNTER NOTE  Provider location: Center for [facility]   Patient location: Home  I connected with Drue Second on 09/02/23 at  1:50 PM EDT by MyChart Video Encounter and verified that I am speaking with the correct person using two identifiers.   I discussed the limitations, risks, security and privacy concerns of performing an evaluation and management service virtually and the availability of in person appointments. I also discussed with the patient that there may be a patient responsible charge related to this service. The patient expressed understanding and agreed to proceed.   History:  Robyn Stone is a 28 y.o. [phone] female being evaluated today for discussion of high grade cervical dysplasia.  She has several questions regarding pap smear results and the colposcopy procedure. She denies any abnormal vaginal discharge, bleeding, pelvic pain or other concerns.       Past Medical History:  Diagnosis Date   Anxiety    Headache    Medical history non-contributory    Pre-eclampsia    Pregnancy induced hypertension    Past Surgical History:  Procedure Laterality Date   NO PAST SURGERIES     The following portions of the patient's history were reviewed and updated as appropriate: allergies, current medications, past family history, past medical history, past social history, past surgical history and problem list.   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  Physical Exam:   General:  Alert, oriented and cooperative. Patient appears to be in no acute distress.  Mental Status: Normal mood and affect. Normal behavior. Normal judgment and thought content.   Respiratory: Normal respiratory effort, no problems with respiration noted  Rest of physical exam deferred due to type of encounter  Labs and  Imaging No results found for this or any previous visit (from the past 2 weeks). No results found.     Assessment and Plan:     1. 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 I discussed the assessment and treatment plan with the patient. The patient has agreed to the colposcopy procedure in the most expedient manner.  Pt did note she does have an IUD in place.   The patient was provided an opportunity to ask questions and all were answered. The patient agreed with the plan and demonstrated an understanding of the instructions.   The patient was advised to call back or seek an in-person evaluation/go to the ED if the symptoms worsen or if the condition fails to improve as anticipated.  I provided 10 minutes of face-to-face time during this encounter. I also spent 10 minutes dedicated to the care of this patient including pre-visit review of records, post visit ordering of medications and appropriate tests or procedures, coordinating care and documenting this visit encounter.    Warden Fillers, MD Center for [employer], [facility]

## 2023-09-08 ENCOUNTER — Ambulatory Visit: Admitting: Advanced Practice Midwife

## 2023-09-08 ENCOUNTER — Encounter: Payer: Self-pay | Admitting: Advanced Practice Midwife

## 2023-09-08 VITALS — BP 111/71 | HR 64 | Ht 62.0 in | Wt 140.4 lb

## 2023-09-08 DIAGNOSIS — Z3009 Encounter for other general counseling and advice on contraception: Secondary | ICD-10-CM | POA: Diagnosis not present

## 2023-09-08 DIAGNOSIS — R87613 High grade squamous intraepithelial lesion on cytologic smear of cervix (HGSIL): Secondary | ICD-10-CM | POA: Insufficient documentation

## 2023-09-08 DIAGNOSIS — Z3041 Encounter for surveillance of contraceptive pills: Secondary | ICD-10-CM

## 2023-09-08 MED ORDER — NORETHINDRONE ACET-ETHINYL EST 1-20 MG-MCG PO TABS: 1.0000 | ORAL_TABLET | Freq: Every day | ORAL | 4 refills | Status: DC

## 2023-09-08 NOTE — Progress Notes (Signed)
 Pt presents for [facility] consult  Pt states IUD came out 2 days ago.  No unprotected sex since  Pt unsure of BC method, not IUD or Depo

## 2023-09-08 NOTE — Progress Notes (Signed)
   GYNECOLOGY PROGRESS NOTE  History:  28 y.o. [id number] presents to [facility] office today for problem gyn visit. She reports her IUD came out 3 days ago after intercourse.  She has not had intercourse since then.  She is interested in changing to another method, Nexplanon vs OCPs.  She denies h/a, dizziness, shortness of breath, n/v, or fever/chills.    The following portions of the patient's history were reviewed and updated as appropriate: allergies, current medications, past family history, past medical history, past social history, past surgical history and problem list. Last pap smear on 08/17/23 was abnormal, positive HRHPV. Colposcopy is scheduled 09/30/23.  Health Maintenance Due  Topic Date Due   COVID-19 Vaccine (1 - 2024-25 season) Never done     Review of Systems:  Pertinent items are noted in HPI.   Objective:  Physical Exam Blood pressure 111/71, pulse 64, height 5\' 2"  (1.575 m), weight 140 lb 6.4 oz (63.7 kg). VS reviewed, nursing note reviewed,  Constitutional: well developed, well nourished, no distress HEENT: normocephalic CV: normal rate Pulm/chest wall: normal effort Breast Exam: deferred Abdomen: soft Neuro: alert and oriented x 3 Skin: warm, dry Psych: affect normal Pelvic exam: Deferred  Assessment & Plan:  1. HSIL on Pap smear of cervix (Primary) --Colpo scheduled 09/30/23 with Dr Donavan Foil  2. Encounter for counseling regarding contraception --IUD came out 3 days ago per pt. I discussed that expulsion rates are ~ 1% so she can have another IUD and it will likely stay in place.  Pt does not want another IUD.   --Discussed pt contraceptive plans and reviewed contraceptive methods based on pt preferences and effectiveness.  Pt prefers to try OCPs.   --Continuous dosing of OCPs discussed, to skip menses.   - norethindrone-ethinyl estradiol (JUNEL 1/20) 1-20 MG-MCG tablet; Take 1 tablet by mouth daily. Take one tablet daily for 21 days then discard the last week  of pills and start a new pack for continuous dosing.  Dispense: 84 tablet; Refill: 4    Return in about 2 months (around 11/08/2023) for Gyn follow up for contraception.   Sharen Counter, CNM 2:58 PM

## 2023-09-12 DIAGNOSIS — Z419 Encounter for procedure for purposes other than remedying health state, unspecified: Secondary | ICD-10-CM | POA: Diagnosis not present

## 2023-09-15 ENCOUNTER — Ambulatory Visit: Admitting: Advanced Practice Midwife

## 2023-09-30 ENCOUNTER — Ambulatory Visit: Admitting: Obstetrics and Gynecology

## 2023-09-30 ENCOUNTER — Encounter: Payer: Self-pay | Admitting: Obstetrics and Gynecology

## 2023-09-30 ENCOUNTER — Other Ambulatory Visit (HOSPITAL_COMMUNITY)
Admission: RE | Admit: 2023-09-30 | Discharge: 2023-09-30 | Disposition: A | Discharge status: Home / Self Care | Source: Ambulatory Visit | Attending: Obstetrics and Gynecology | Admitting: Obstetrics and Gynecology

## 2023-09-30 VITALS — BP 108/72 | HR 68 | Ht 62.0 in | Wt 140.0 lb

## 2023-09-30 DIAGNOSIS — Z3202 Encounter for pregnancy test, result negative: Secondary | ICD-10-CM

## 2023-09-30 DIAGNOSIS — D069 Carcinoma in situ of cervix, unspecified: Secondary | ICD-10-CM | POA: Diagnosis not present

## 2023-09-30 DIAGNOSIS — R87613 High grade squamous intraepithelial lesion on cytologic smear of cervix (HGSIL): Secondary | ICD-10-CM

## 2023-09-30 LAB — POCT URINE PREGNANCY: Preg Test, Ur: NEGATIVE

## 2023-09-30 NOTE — Progress Notes (Signed)
 28 y.o. GYN presents for COLPO for   High grade squamous intraepithelial lesion (HSIL) Abnormal    UPT Negative

## 2023-09-30 NOTE — Progress Notes (Signed)
    GYNECOLOGY CLINIC COLPOSCOPY PROCEDURE NOTE  27 y.o. [id number] here for colposcopy for pap finding of:  Result Date Procedure Results Follow-ups  08/17/2023 Cytology - PAP( [facility]) Adequacy: Satisfactory for evaluation; transformation zone component PRESENT. Diagnosis: - High grade squamous intraepithelial lesion (HSIL) (A)   10/23/2020 Cytology - PAP( [place]) High risk HPV: Positive (A) HPV 16: Positive (A) HPV 18 / 45: Negative Adequacy: Satisfactory for evaluation; transformation zone component PRESENT. Diagnosis: - Negative for intraepithelial lesion or malignancy (NILM) Comment: Normal Reference Range HPV - Negative Comment: Normal Reference Range HPV 16 18 45 -Negative   07/13/2019 Cytology - PAP( [place]) Neisseria Gonorrhea: Negative Chlamydia: Positive (A) Adequacy: Satisfactory for evaluation; transformation zone component PRESENT. Diagnosis: - Negative for intraepithelial lesion or malignancy (NILM) Comment: Normal Reference [place] Chlamydia - Negative Comment: Normal Reference Range Neisseria Gonorrhea - Negative   06/30/2018 Cytology - PAP( [place]) Adequacy: Satisfactory for evaluation  endocervical/transformation zone component PRESENT. (A) Diagnosis: ATYPICAL SQUAMOUS CELLS OF UNDETERMINED SIGNIFICANCE (ASC-US ). (A) HPV: DETECTED (A) Material Submitted: CervicoVaginal Pap [ThinPrep Imaged] (A) CYTOLOGY - PAP: PAP RESULT     Discussed role for HPV in cervical dysplasia, need for surveillance, nature of the procedure, and risks and benefits.  Pregnancy test: Lab Results  Component Value Date   PREGTESTUR Negative 11/13/2022    No Known Allergies  Patient given informed consent, signed copy in the chart, time out was performed.    Placed in lithotomy position. Cervix viewed with speculum and colposcope after application of acetic acid.   Colposcopy Adequacy Cervix fully visualized: Yes  SCJ fully visualized: Yes  Colposcopy  Findings acetowhite lesion(s) noted at 7 and 10 o'clock and abnormal vessels noted at 10 o'clock  Corresponding biopsies were obtained.    ECC specimen was obtained.  All specimens were labeled and sent to pathology.  Hemostatic measures: Monsel's solution  Complications: none  Patient tolerated the procedure well.  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  Will follow up pathology and manage accordingly; patient will be contacted with results and recommendations.  Routine preventative health maintenance measures emphasized.   Abigail Abler, MD

## 2023-10-02 ENCOUNTER — Encounter: Payer: Self-pay | Admitting: Obstetrics and Gynecology

## 2023-10-02 LAB — SURGICAL PATHOLOGY

## 2023-10-05 ENCOUNTER — Encounter: Payer: Self-pay | Admitting: Obstetrics and Gynecology

## 2023-10-12 DIAGNOSIS — Z419 Encounter for procedure for purposes other than remedying health state, unspecified: Secondary | ICD-10-CM | POA: Diagnosis not present

## 2023-10-28 ENCOUNTER — Encounter: Payer: Self-pay | Admitting: Obstetrics and Gynecology

## 2023-10-28 ENCOUNTER — Telehealth: Payer: Self-pay | Admitting: Family Medicine

## 2023-10-28 ENCOUNTER — Ambulatory Visit: Admitting: Obstetrics and Gynecology

## 2023-10-28 VITALS — BP 98/67 | HR 75 | Ht 62.0 in | Wt 140.0 lb

## 2023-10-28 DIAGNOSIS — R87613 High grade squamous intraepithelial lesion on cytologic smear of cervix (HGSIL): Secondary | ICD-10-CM

## 2023-10-28 DIAGNOSIS — Z3202 Encounter for pregnancy test, result negative: Secondary | ICD-10-CM

## 2023-10-28 LAB — POCT URINE PREGNANCY: Preg Test, Ur: NEGATIVE

## 2023-10-28 NOTE — Telephone Encounter (Signed)
 Called patient to get scheduled for a leep with Dr.Bass. Left detailed message with call back number.

## 2023-10-28 NOTE — Progress Notes (Signed)
 28 y.o. 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  UPT Negative.

## 2023-10-28 NOTE — Progress Notes (Signed)
 Pt presented for LEEP.  After monsels's was applied she had two geographically distinct areas of concern.  One area at 1-3 o'clock and another area from 4-6 o'clock. Ideally pt would need 2 separate passes to get the abnormal area and have good margins.  Use of cone apparatus would decapitate the cervix and may not capture both sites at the same time. Pt will be moved to [facility] for the LEEP procedure,  where loops can be utilized.  Avie Boeck, MD

## 2023-11-05 ENCOUNTER — Encounter: Payer: Self-pay | Admitting: Obstetrics and Gynecology

## 2023-11-12 DIAGNOSIS — Z419 Encounter for procedure for purposes other than remedying health state, unspecified: Secondary | ICD-10-CM | POA: Diagnosis not present

## 2023-11-19 ENCOUNTER — Other Ambulatory Visit

## 2023-11-23 ENCOUNTER — Other Ambulatory Visit

## 2023-12-12 DIAGNOSIS — Z419 Encounter for procedure for purposes other than remedying health state, unspecified: Secondary | ICD-10-CM | POA: Diagnosis not present

## 2023-12-17 ENCOUNTER — Ambulatory Visit: Admitting: Obstetrics and Gynecology

## 2024-01-12 DIAGNOSIS — Z419 Encounter for procedure for purposes other than remedying health state, unspecified: Secondary | ICD-10-CM | POA: Diagnosis not present

## 2024-01-25 ENCOUNTER — Encounter: Payer: Self-pay | Admitting: Obstetrics and Gynecology

## 2024-01-26 ENCOUNTER — Ambulatory Visit: Admitting: Obstetrics and Gynecology

## 2024-01-26 ENCOUNTER — Other Ambulatory Visit: Payer: Self-pay

## 2024-01-26 ENCOUNTER — Encounter: Payer: Self-pay | Admitting: Obstetrics and Gynecology

## 2024-01-26 ENCOUNTER — Encounter: Payer: Self-pay | Admitting: Family Medicine

## 2024-01-26 VITALS — BP 90/69 | HR 93 | Wt 130.0 lb

## 2024-01-26 DIAGNOSIS — R87613 High grade squamous intraepithelial lesion on cytologic smear of cervix (HGSIL): Secondary | ICD-10-CM | POA: Diagnosis not present

## 2024-01-26 DIAGNOSIS — Z3202 Encounter for pregnancy test, result negative: Secondary | ICD-10-CM

## 2024-01-26 DIAGNOSIS — Z1331 Encounter for screening for depression: Secondary | ICD-10-CM

## 2024-01-26 DIAGNOSIS — Z3009 Encounter for other general counseling and advice on contraception: Secondary | ICD-10-CM

## 2024-01-26 LAB — POCT PREGNANCY, URINE: Preg Test, Ur: NEGATIVE

## 2024-01-26 MED ORDER — NORGESTIMATE-ETH ESTRADIOL 0.25-35 MG-MCG PO TABS: 1.0000 | ORAL_TABLET | Freq: Every day | ORAL | 11 refills | Status: AC

## 2024-01-26 NOTE — Progress Notes (Signed)
 GYNECOLOGY OFFICE NOTE  History:  28 y.o. [id number] here today for discussion of abnormal bleeding. Switched from IUD to [facility] in April of this year, has had regular monthly bleeding lasting 3 days with OCPs. Then started bleeding around Aug 15 and has had intermittent bleeding since. She is concerned about intermittent bleeding. Reports she misses 2-3 days of OCPs per month and always takes extra one when she remembers.   Has LEEP scheduled, was supposed to get today but recommend she have procedure done with Dr. Zina since he requested a specific loop, she is agreeable to this.  Past Medical History:  Diagnosis Date   Anxiety    Headache    Medical history non-contributory    Pre-eclampsia    Pregnancy induced hypertension     Past Surgical History:  Procedure Laterality Date   NO PAST SURGERIES       Current Outpatient Medications:    ibuprofen  (ADVIL ) 600 MG tablet, Take 1 tablet (600 mg total) by mouth every 6 (six) hours as needed., Disp: 30 tablet, Rfl: 0   norgestimate -ethinyl estradiol  (ORTHO-CYCLEN) 0.25-35 MG-MCG tablet, Take 1 tablet by mouth daily., Disp: 28 tablet, Rfl: 11   acetaminophen  (TYLENOL ) 500 MG tablet, Take 1-2 tablets (500-1,000 mg total) by mouth every 6 (six) hours as needed for moderate pain, fever or headache. (Patient not taking: Reported on 01/26/2024), Disp: 60 tablet, Rfl: 2  The following portions of the patient's history were reviewed and updated as appropriate: allergies, current medications, past family history, past medical history, past social history, past surgical history and problem list.   Review of Systems:  Pertinent items noted in HPI and remainder of comprehensive ROS otherwise negative.   Objective:  Physical Exam BP 90/69   Pulse 93   Wt 130 lb (59 kg)   LMP 01/15/2024   BMI 23.78 kg/m  CONSTITUTIONAL: Well-developed, well-nourished female in no acute distress.  HENT:  Normocephalic, atraumatic. External right and left ear  normal. Oropharynx is clear and moist EYES: Conjunctivae and EOM are normal. Pupils are equal, round, and reactive to light. No scleral icterus.  NECK: Normal range of motion, supple, no masses SKIN: Skin is warm and dry. No rash noted. Not diaphoretic. No erythema. No pallor. NEUROLOGIC: Alert and oriented to person, place, and time. Normal reflexes, muscle tone coordination. No cranial nerve deficit noted. PSYCHIATRIC: Normal mood and affect. Normal behavior. Normal judgment and thought content. CARDIOVASCULAR: Normal heart rate noted RESPIRATORY: Effort normal, no problems with respiration noted ABDOMEN: Soft, no distention noted.   PELVIC: deferred MUSCULOSKELETAL: Normal range of motion. No edema noted.  Labs and Imaging No results found.  UPT negative  Assessment & Plan:  1. HSIL on Pap smear of cervix (Primary) Needs LEEP, f/u with Dr. Zina  2. Encounter for counseling regarding contraception -Reviewed OCPs and efficacy, lower efficacy of OCPs with missed pills and likelihood that bleeding is secondary to missed pills -Reviewed options for birth control including oral contraceptive pills (combination and progesterone only), NuvaRing, Depo-Provera, Nexplanon, IUDs (copper  and levonorgestrol). Thoroughly reviewed risks/benefits/side effects of each. She is interested in depo but wants to stick with OCPs for now -Will change formulation of OCPs and see if bleeding is improved - sprintec sent to pharmacy   Routine preventative health maintenance measures emphasized. Please refer to After Visit Summary for other counseling recommendations.   Return in about 2 weeks (around 02/09/2024) for Followup for LEEP with Dr. Zina.   LOIS Yolanda Moats, MD, [facility] Attending  Center for [employer] [facility]wife)

## 2024-01-27 ENCOUNTER — Encounter: Payer: Self-pay | Admitting: Obstetrics and Gynecology

## 2024-01-28 ENCOUNTER — Telehealth: Payer: Self-pay

## 2024-02-12 DIAGNOSIS — Z419 Encounter for procedure for purposes other than remedying health state, unspecified: Secondary | ICD-10-CM | POA: Diagnosis not present

## 2024-02-15 ENCOUNTER — Encounter: Payer: Self-pay | Admitting: Obstetrics and Gynecology

## 2024-02-15 DIAGNOSIS — J939 Pneumothorax, unspecified: Secondary | ICD-10-CM | POA: Diagnosis not present

## 2024-02-15 DIAGNOSIS — J9312 Secondary spontaneous pneumothorax: Secondary | ICD-10-CM | POA: Diagnosis not present

## 2024-02-15 DIAGNOSIS — F17290 Nicotine dependence, other tobacco product, uncomplicated: Secondary | ICD-10-CM | POA: Diagnosis not present

## 2024-02-15 DIAGNOSIS — Z7982 Long term (current) use of aspirin: Secondary | ICD-10-CM | POA: Diagnosis not present

## 2024-02-15 DIAGNOSIS — U070 Vaping-related disorder: Secondary | ICD-10-CM | POA: Diagnosis not present

## 2024-02-15 DIAGNOSIS — F17210 Nicotine dependence, cigarettes, uncomplicated: Secondary | ICD-10-CM | POA: Diagnosis not present

## 2024-02-15 DIAGNOSIS — J982 Interstitial emphysema: Secondary | ICD-10-CM | POA: Diagnosis not present

## 2024-02-16 DIAGNOSIS — R0602 Shortness of breath: Secondary | ICD-10-CM | POA: Diagnosis not present

## 2024-02-16 DIAGNOSIS — J982 Interstitial emphysema: Secondary | ICD-10-CM | POA: Diagnosis not present

## 2024-03-09 ENCOUNTER — Ambulatory Visit: Admitting: Obstetrics and Gynecology

## 2024-04-13 DIAGNOSIS — Z419 Encounter for procedure for purposes other than remedying health state, unspecified: Secondary | ICD-10-CM | POA: Diagnosis not present
